# Patient Record
Sex: Female | Born: 1935 | Race: Black or African American | Hispanic: No | Marital: Single | Smoking: Never smoker
Health system: Southern US, Community
[De-identification: ages and names within clinical notes are randomized; demographics above are authoritative.]

## PROBLEM LIST (undated history)

## (undated) DIAGNOSIS — I509 Heart failure, unspecified: Secondary | ICD-10-CM

## (undated) DIAGNOSIS — I2510 Atherosclerotic heart disease of native coronary artery without angina pectoris: Secondary | ICD-10-CM

## (undated) DIAGNOSIS — E785 Hyperlipidemia, unspecified: Secondary | ICD-10-CM

## (undated) DIAGNOSIS — N189 Chronic kidney disease, unspecified: Secondary | ICD-10-CM

## (undated) DIAGNOSIS — I639 Cerebral infarction, unspecified: Secondary | ICD-10-CM

## (undated) DIAGNOSIS — I272 Pulmonary hypertension, unspecified: Secondary | ICD-10-CM

## (undated) HISTORY — DX: Heart failure, unspecified: I50.9

## (undated) HISTORY — DX: Chronic kidney disease, unspecified: N18.9

## (undated) HISTORY — PX: CORONARY ARTERY BYPASS GRAFT: SHX141

## (undated) HISTORY — PX: TOTAL HIP ARTHROPLASTY: SHX124

## (undated) HISTORY — DX: Cerebral infarction, unspecified: I63.9

## (undated) HISTORY — PX: BRAIN SURGERY: SHX531

## (undated) HISTORY — DX: Hyperlipidemia, unspecified: E78.5

## (undated) HISTORY — DX: Pulmonary hypertension, unspecified: I27.20

## (undated) HISTORY — DX: Atherosclerotic heart disease of native coronary artery without angina pectoris: I25.10

---

## 2009-02-22 ENCOUNTER — Inpatient Hospital Stay (HOSPITAL_COMMUNITY): Admission: EM | Admit: 2009-02-22 | Discharge: 2009-02-25 | Payer: Self-pay | Admitting: Emergency Medicine

## 2009-02-22 ENCOUNTER — Ambulatory Visit: Payer: Self-pay | Admitting: Internal Medicine

## 2009-02-23 ENCOUNTER — Encounter (INDEPENDENT_AMBULATORY_CARE_PROVIDER_SITE_OTHER): Payer: Self-pay | Admitting: Internal Medicine

## 2009-03-07 DIAGNOSIS — E785 Hyperlipidemia, unspecified: Secondary | ICD-10-CM | POA: Insufficient documentation

## 2009-03-07 DIAGNOSIS — I509 Heart failure, unspecified: Secondary | ICD-10-CM | POA: Insufficient documentation

## 2009-03-07 DIAGNOSIS — I2510 Atherosclerotic heart disease of native coronary artery without angina pectoris: Secondary | ICD-10-CM | POA: Insufficient documentation

## 2009-03-07 DIAGNOSIS — E119 Type 2 diabetes mellitus without complications: Secondary | ICD-10-CM | POA: Insufficient documentation

## 2009-03-07 DIAGNOSIS — I6350 Cerebral infarction due to unspecified occlusion or stenosis of unspecified cerebral artery: Secondary | ICD-10-CM | POA: Insufficient documentation

## 2009-03-07 DIAGNOSIS — I2789 Other specified pulmonary heart diseases: Secondary | ICD-10-CM | POA: Insufficient documentation

## 2009-03-08 ENCOUNTER — Encounter (INDEPENDENT_AMBULATORY_CARE_PROVIDER_SITE_OTHER): Payer: Self-pay | Admitting: Nurse Practitioner

## 2009-03-08 ENCOUNTER — Ambulatory Visit: Payer: Self-pay | Admitting: Cardiology

## 2009-03-11 ENCOUNTER — Encounter: Payer: Self-pay | Admitting: Internal Medicine

## 2009-03-15 ENCOUNTER — Ambulatory Visit: Payer: Self-pay | Admitting: Cardiology

## 2009-03-15 ENCOUNTER — Encounter: Payer: Self-pay | Admitting: Internal Medicine

## 2009-03-16 LAB — CONVERTED CEMR LAB
BUN: 39 mg/dL — ABNORMAL HIGH (ref 6–23)
CO2: 27 meq/L (ref 19–32)
Calcium: 9.5 mg/dL (ref 8.4–10.5)
GFR calc non Af Amer: 42.13 mL/min (ref >60)
Glucose, Bld: 171 mg/dL — ABNORMAL HIGH (ref 70–99)

## 2009-03-17 ENCOUNTER — Encounter: Payer: Self-pay | Admitting: Internal Medicine

## 2009-03-28 ENCOUNTER — Encounter: Payer: Self-pay | Admitting: Internal Medicine

## 2009-03-29 ENCOUNTER — Telehealth (INDEPENDENT_AMBULATORY_CARE_PROVIDER_SITE_OTHER): Payer: Self-pay | Admitting: Nurse Practitioner

## 2009-04-06 ENCOUNTER — Encounter: Admission: RE | Admit: 2009-04-06 | Discharge: 2009-04-06 | Payer: Self-pay | Admitting: Internal Medicine

## 2009-05-12 ENCOUNTER — Ambulatory Visit: Payer: Self-pay | Admitting: Cardiology

## 2009-05-12 DIAGNOSIS — I10 Essential (primary) hypertension: Secondary | ICD-10-CM | POA: Insufficient documentation

## 2009-05-23 ENCOUNTER — Ambulatory Visit: Payer: Self-pay | Admitting: Internal Medicine

## 2009-05-29 LAB — CONVERTED CEMR LAB
BUN: 22 mg/dL (ref 6–23)
Creatinine, Ser: 1.4 mg/dL — ABNORMAL HIGH (ref 0.4–1.2)
GFR calc non Af Amer: 45.6 mL/min (ref >60)
Glucose, Bld: 93 mg/dL (ref 70–99)
Potassium: 4.4 meq/L (ref 3.5–5.1)

## 2009-06-10 ENCOUNTER — Ambulatory Visit: Payer: Self-pay | Admitting: Emergency Medicine

## 2009-06-10 ENCOUNTER — Telehealth: Payer: Self-pay | Admitting: Internal Medicine

## 2009-06-10 DIAGNOSIS — R0602 Shortness of breath: Secondary | ICD-10-CM | POA: Insufficient documentation

## 2009-06-14 ENCOUNTER — Ambulatory Visit: Payer: Self-pay | Admitting: Cardiology

## 2009-06-14 ENCOUNTER — Inpatient Hospital Stay (HOSPITAL_COMMUNITY): Admission: AD | Admit: 2009-06-14 | Discharge: 2009-06-17 | Payer: Self-pay | Admitting: Cardiology

## 2009-06-19 ENCOUNTER — Ambulatory Visit: Payer: Self-pay | Admitting: Internal Medicine

## 2009-06-24 ENCOUNTER — Encounter: Payer: Self-pay | Admitting: Cardiology

## 2009-07-05 ENCOUNTER — Encounter: Payer: Self-pay | Admitting: Internal Medicine

## 2009-07-13 ENCOUNTER — Ambulatory Visit: Payer: Self-pay | Admitting: Emergency Medicine

## 2009-07-15 ENCOUNTER — Encounter: Payer: Self-pay | Admitting: Internal Medicine

## 2009-08-01 ENCOUNTER — Ambulatory Visit: Payer: Self-pay | Admitting: Cardiology

## 2009-08-23 ENCOUNTER — Ambulatory Visit: Payer: Self-pay | Admitting: Cardiology

## 2009-08-23 ENCOUNTER — Inpatient Hospital Stay (HOSPITAL_COMMUNITY): Admission: AD | Admit: 2009-08-23 | Discharge: 2009-09-19 | Payer: Self-pay | Admitting: Cardiology

## 2009-08-23 ENCOUNTER — Ambulatory Visit: Payer: Self-pay | Admitting: Cardiovascular Disease

## 2009-08-23 ENCOUNTER — Telehealth: Payer: Self-pay | Admitting: Cardiology

## 2009-08-29 ENCOUNTER — Ambulatory Visit: Payer: Self-pay | Admitting: Physical Medicine & Rehabilitation

## 2009-09-06 ENCOUNTER — Encounter: Payer: Self-pay | Admitting: Internal Medicine

## 2009-09-07 ENCOUNTER — Encounter: Payer: Self-pay | Admitting: Cardiology

## 2009-09-10 ENCOUNTER — Encounter: Payer: Self-pay | Admitting: Cardiology

## 2009-09-13 ENCOUNTER — Encounter: Payer: Self-pay | Admitting: Cardiology

## 2009-09-19 ENCOUNTER — Encounter: Payer: Self-pay | Admitting: Cardiology

## 2009-09-21 ENCOUNTER — Encounter: Payer: Self-pay | Admitting: Cardiology

## 2009-09-21 ENCOUNTER — Encounter: Payer: Self-pay | Admitting: Internal Medicine

## 2009-09-21 LAB — CONVERTED CEMR LAB: Prothrombin Time: 28.3 s

## 2009-09-23 ENCOUNTER — Encounter: Payer: Self-pay | Admitting: Cardiology

## 2009-09-26 ENCOUNTER — Encounter: Payer: Self-pay | Admitting: Cardiology

## 2009-09-26 LAB — CONVERTED CEMR LAB
POC INR: 2
Prothrombin Time: 17.3 s

## 2009-09-30 ENCOUNTER — Encounter: Payer: Self-pay | Admitting: Cardiology

## 2009-09-30 ENCOUNTER — Ambulatory Visit: Payer: Self-pay | Admitting: Cardiology

## 2009-09-30 DIAGNOSIS — I495 Sick sinus syndrome: Secondary | ICD-10-CM

## 2009-10-03 ENCOUNTER — Encounter: Payer: Self-pay | Admitting: Cardiology

## 2009-10-03 LAB — CONVERTED CEMR LAB: POC INR: 2.1

## 2009-10-10 ENCOUNTER — Encounter: Payer: Self-pay | Admitting: Cardiology

## 2009-10-10 ENCOUNTER — Encounter (INDEPENDENT_AMBULATORY_CARE_PROVIDER_SITE_OTHER): Payer: Self-pay | Admitting: Cardiology

## 2009-10-10 LAB — CONVERTED CEMR LAB
POC INR: 5.3
Prothrombin Time: 36.3 s

## 2009-10-17 ENCOUNTER — Encounter: Payer: Self-pay | Admitting: Cardiology

## 2009-10-18 ENCOUNTER — Encounter: Payer: Self-pay | Admitting: Cardiology

## 2009-10-18 LAB — CONVERTED CEMR LAB
POC INR: 2.6
Prothrombin Time: 19.6 s

## 2009-10-27 ENCOUNTER — Telehealth (INDEPENDENT_AMBULATORY_CARE_PROVIDER_SITE_OTHER): Payer: Self-pay | Admitting: *Deleted

## 2009-10-28 ENCOUNTER — Encounter: Payer: Self-pay | Admitting: Cardiology

## 2009-11-01 ENCOUNTER — Encounter: Payer: Self-pay | Admitting: Cardiology

## 2009-11-02 ENCOUNTER — Encounter: Payer: Self-pay | Admitting: Cardiology

## 2009-11-03 ENCOUNTER — Encounter: Payer: Self-pay | Admitting: Cardiology

## 2009-11-03 ENCOUNTER — Ambulatory Visit: Payer: Self-pay | Admitting: Cardiology

## 2009-11-03 DIAGNOSIS — Z950 Presence of cardiac pacemaker: Secondary | ICD-10-CM | POA: Insufficient documentation

## 2009-11-03 DIAGNOSIS — N259 Disorder resulting from impaired renal tubular function, unspecified: Secondary | ICD-10-CM | POA: Insufficient documentation

## 2009-11-04 LAB — CONVERTED CEMR LAB
Albumin: 3.1 g/dL — ABNORMAL LOW (ref 3.5–5.2)
BUN: 22 mg/dL (ref 6–23)
Basophils Absolute: 0 10*3/uL (ref 0.0–0.1)
Basophils Relative: 0.3 % (ref 0.0–3.0)
CO2: 34 meq/L — ABNORMAL HIGH (ref 19–32)
Eosinophils Absolute: 0.2 10*3/uL (ref 0.0–0.7)
GFR calc non Af Amer: 49.63 mL/min (ref >60)
Glucose, Bld: 121 mg/dL — ABNORMAL HIGH (ref 70–99)
Hemoglobin: 12 g/dL (ref 12.0–15.0)
Lymphs Abs: 2.5 10*3/uL (ref 0.7–4.0)
MCHC: 32.9 g/dL (ref 30.0–36.0)
MCV: 95.6 fL (ref 78.0–100.0)
Monocytes Absolute: 0.6 10*3/uL (ref 0.1–1.0)
Neutro Abs: 3 10*3/uL (ref 1.4–7.7)
Potassium: 4.8 meq/L (ref 3.5–5.1)
RBC: 3.82 M/uL — ABNORMAL LOW (ref 3.87–5.11)
RDW: 14.6 % (ref 11.5–14.6)
Sodium: 143 meq/L (ref 135–145)
Total Protein: 6.3 g/dL (ref 6.0–8.3)

## 2009-11-07 ENCOUNTER — Encounter (INDEPENDENT_AMBULATORY_CARE_PROVIDER_SITE_OTHER): Payer: Self-pay | Admitting: Cardiology

## 2009-11-08 ENCOUNTER — Encounter: Payer: Self-pay | Admitting: Cardiovascular Disease

## 2009-11-08 LAB — CONVERTED CEMR LAB
POC INR: 1.7
Prothrombin Time: 19.8 s

## 2009-11-09 ENCOUNTER — Ambulatory Visit: Payer: Self-pay | Admitting: Cardiology

## 2009-11-09 ENCOUNTER — Encounter: Payer: Self-pay | Admitting: Physician Assistant

## 2009-11-09 DIAGNOSIS — I5033 Acute on chronic diastolic (congestive) heart failure: Secondary | ICD-10-CM

## 2009-11-14 ENCOUNTER — Encounter: Payer: Self-pay | Admitting: Physician Assistant

## 2009-11-15 ENCOUNTER — Encounter: Payer: Self-pay | Admitting: Cardiology

## 2009-11-15 LAB — CONVERTED CEMR LAB
POC INR: 1.5
Prothrombin Time: 15.1 s

## 2009-11-16 ENCOUNTER — Encounter: Payer: Self-pay | Admitting: Physician Assistant

## 2009-11-16 ENCOUNTER — Ambulatory Visit: Payer: Self-pay | Admitting: Cardiovascular Disease

## 2009-11-16 ENCOUNTER — Ambulatory Visit: Payer: Self-pay | Admitting: Cardiology

## 2009-11-16 DIAGNOSIS — I4891 Unspecified atrial fibrillation: Secondary | ICD-10-CM

## 2009-11-16 DIAGNOSIS — I25810 Atherosclerosis of coronary artery bypass graft(s) without angina pectoris: Secondary | ICD-10-CM | POA: Insufficient documentation

## 2009-11-16 LAB — CONVERTED CEMR LAB
BUN: 20 mg/dL (ref 6–23)
Creatinine, Ser: 1.4 mg/dL — ABNORMAL HIGH (ref 0.4–1.2)
GFR calc non Af Amer: 45.55 mL/min (ref >60)
Glucose, Bld: 142 mg/dL — ABNORMAL HIGH (ref 70–99)
Potassium: 4.4 meq/L (ref 3.5–5.1)

## 2009-11-17 ENCOUNTER — Encounter: Payer: Self-pay | Admitting: Cardiology

## 2009-11-23 ENCOUNTER — Encounter: Payer: Self-pay | Admitting: Internal Medicine

## 2009-11-23 ENCOUNTER — Ambulatory Visit: Payer: Self-pay | Admitting: Internal Medicine

## 2009-11-23 LAB — CONVERTED CEMR LAB
Bilirubin Urine: NEGATIVE
Ketones, ur: NEGATIVE mg/dL
Specific Gravity, Urine: 1.015 (ref 1.000–1.030)
Urine Glucose: NEGATIVE mg/dL
Urobilinogen, UA: 1 (ref 0.0–1.0)

## 2009-11-30 ENCOUNTER — Encounter: Payer: Self-pay | Admitting: Internal Medicine

## 2009-11-30 LAB — CONVERTED CEMR LAB
POC INR: 1.8
Prothrombin Time: 21.7 s

## 2009-12-02 ENCOUNTER — Encounter: Payer: Self-pay | Admitting: Cardiology

## 2009-12-07 ENCOUNTER — Ambulatory Visit: Payer: Self-pay | Admitting: Cardiology

## 2009-12-07 ENCOUNTER — Encounter: Payer: Self-pay | Admitting: Cardiovascular Disease

## 2009-12-07 ENCOUNTER — Encounter: Payer: Self-pay | Admitting: Cardiology

## 2009-12-07 LAB — CONVERTED CEMR LAB: Prothrombin Time: 19.1 s

## 2009-12-08 LAB — CONVERTED CEMR LAB
CO2: 38 meq/L — ABNORMAL HIGH (ref 19–32)
Calcium: 9.7 mg/dL (ref 8.4–10.5)
GFR calc non Af Amer: 39.04 mL/min (ref >60)
INR: 1.8 — ABNORMAL HIGH (ref 0.8–1.0)
Potassium: 4.1 meq/L (ref 3.5–5.1)
Prothrombin Time: 19.1 s — ABNORMAL HIGH (ref 9.1–11.7)
Sodium: 141 meq/L (ref 135–145)

## 2009-12-10 ENCOUNTER — Encounter: Payer: Self-pay | Admitting: Cardiology

## 2009-12-14 ENCOUNTER — Encounter: Payer: Self-pay | Admitting: Cardiology

## 2009-12-21 ENCOUNTER — Ambulatory Visit: Payer: Self-pay | Admitting: Internal Medicine

## 2009-12-26 ENCOUNTER — Encounter: Payer: Self-pay | Admitting: Cardiology

## 2010-01-17 ENCOUNTER — Encounter: Payer: Self-pay | Admitting: Cardiology

## 2010-01-18 ENCOUNTER — Ambulatory Visit: Payer: Self-pay | Admitting: Internal Medicine

## 2010-01-20 ENCOUNTER — Encounter: Payer: Self-pay | Admitting: Cardiology

## 2010-02-07 ENCOUNTER — Telehealth: Payer: Self-pay | Admitting: Cardiology

## 2010-02-09 ENCOUNTER — Ambulatory Visit: Payer: Self-pay | Admitting: Cardiology

## 2010-02-10 LAB — CONVERTED CEMR LAB
BUN: 36 mg/dL — ABNORMAL HIGH (ref 6–23)
CO2: 34 meq/L — ABNORMAL HIGH (ref 19–32)
Chloride: 106 meq/L (ref 96–112)
Creatinine, Ser: 1.7 mg/dL — ABNORMAL HIGH (ref 0.4–1.2)

## 2010-02-17 ENCOUNTER — Ambulatory Visit: Payer: Self-pay | Admitting: Cardiovascular Disease

## 2010-03-01 ENCOUNTER — Encounter: Payer: Self-pay | Admitting: Cardiology

## 2010-03-17 ENCOUNTER — Ambulatory Visit: Payer: Self-pay | Admitting: Cardiology

## 2010-04-14 ENCOUNTER — Ambulatory Visit: Payer: Self-pay | Admitting: Cardiovascular Disease

## 2010-04-18 ENCOUNTER — Encounter: Payer: Self-pay | Admitting: Cardiology

## 2010-05-08 IMAGING — CR DG CHEST 1V PORT
1 series · 1 of 1 positions shown · non-contrast
Comparison: 02/22/2009

CLINICAL DATA: Evaluate PICC line

PORTABLE CHEST - 1 VIEW

[view not recorded]
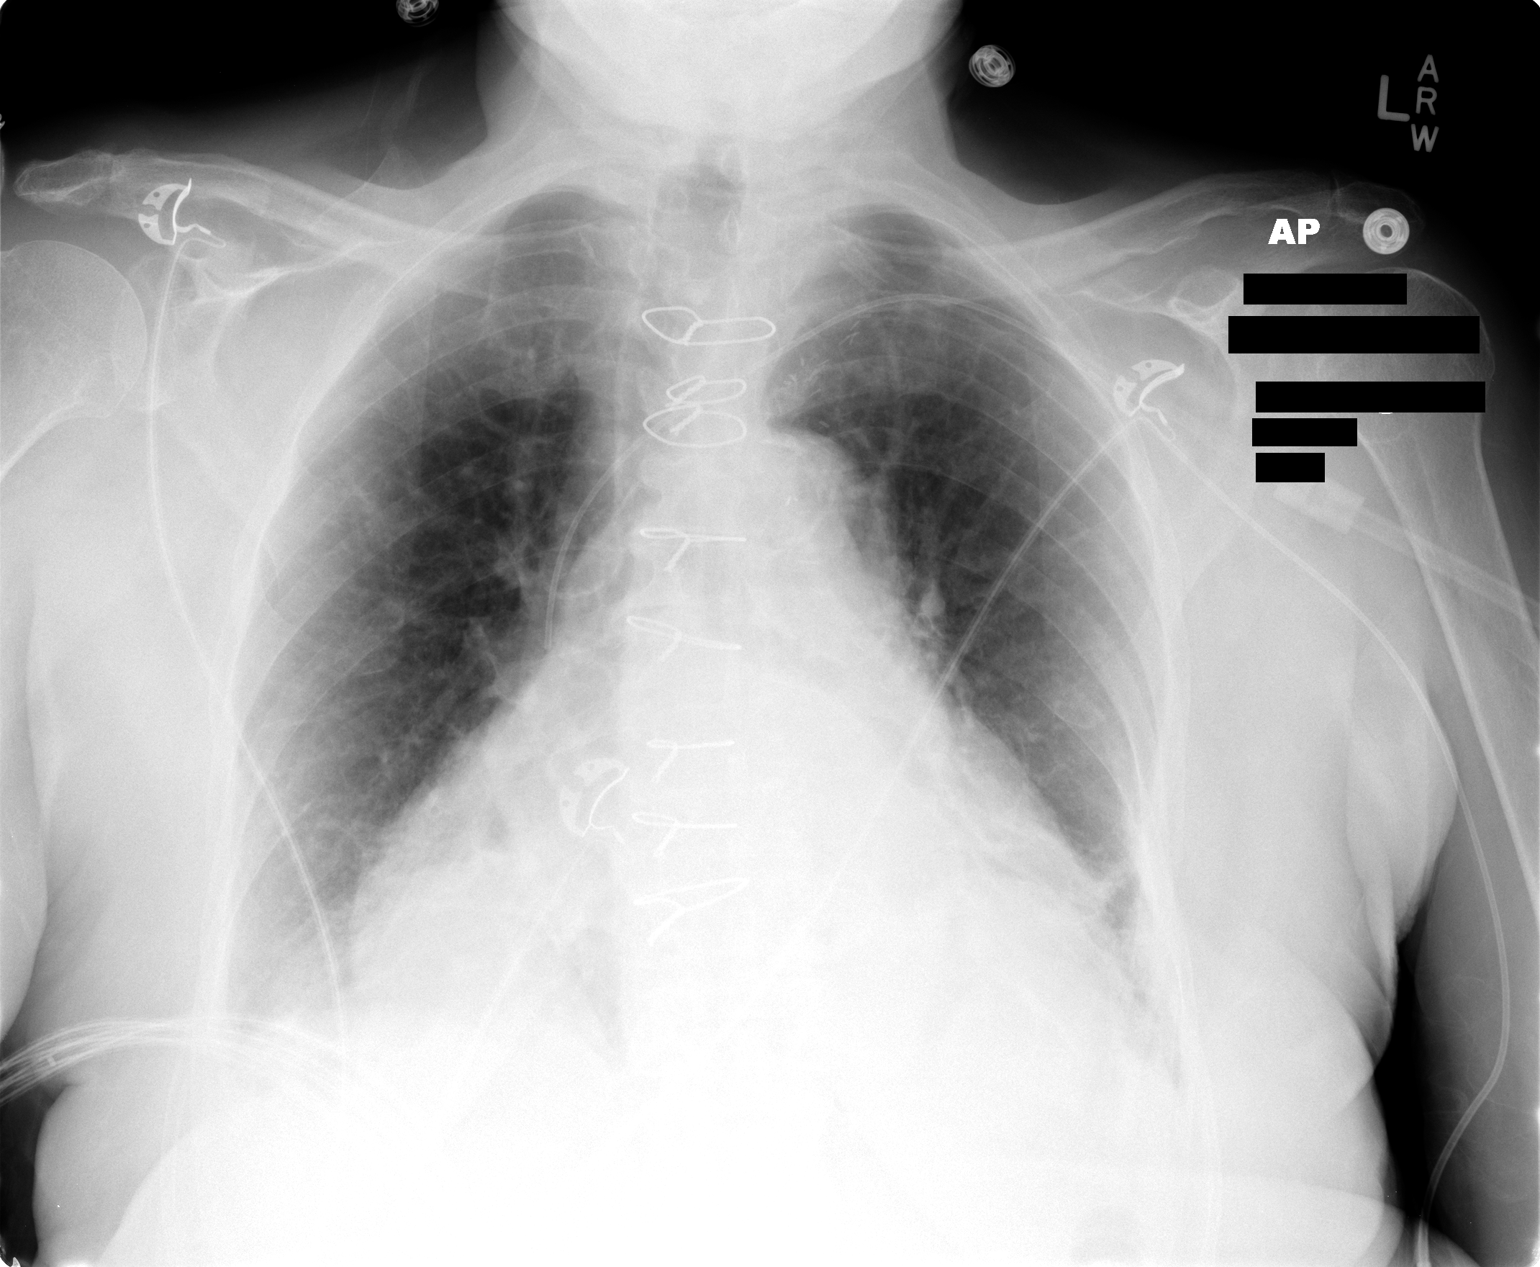

[1 of 1 positions shown; findings below may reference images not displayed]

FINDINGS: The tip of the PICC catheter is in the superior vena
cava, 3 cm proximal to the SVC right atrial junction.  Cardiac
enlargement.  Bibasilar atelectasis.
IMPRESSION: PICC catheter in superior vena cava.  The tip is 3 cm proximal to
the SVC right atrial junction.

## 2010-05-12 ENCOUNTER — Ambulatory Visit: Payer: Self-pay | Admitting: Cardiology

## 2010-06-08 ENCOUNTER — Encounter: Payer: Self-pay | Admitting: Cardiology

## 2010-06-08 LAB — CONVERTED CEMR LAB
POC INR: 3
Prothrombin Time: 31 s

## 2010-07-06 ENCOUNTER — Encounter: Payer: Self-pay | Admitting: Internal Medicine

## 2010-07-06 LAB — CONVERTED CEMR LAB: POC INR: 2.5

## 2010-07-07 ENCOUNTER — Encounter (INDEPENDENT_AMBULATORY_CARE_PROVIDER_SITE_OTHER): Payer: Self-pay | Admitting: *Deleted

## 2010-08-01 ENCOUNTER — Encounter: Payer: Self-pay | Admitting: Cardiovascular Disease

## 2010-08-01 LAB — CONVERTED CEMR LAB
POC INR: 2.7
Prothrombin Time: 28.2 s

## 2010-08-10 ENCOUNTER — Telehealth (INDEPENDENT_AMBULATORY_CARE_PROVIDER_SITE_OTHER): Payer: Self-pay | Admitting: *Deleted

## 2010-08-11 ENCOUNTER — Ambulatory Visit: Payer: Self-pay | Admitting: Cardiology

## 2010-08-18 ENCOUNTER — Encounter: Payer: Self-pay | Admitting: Internal Medicine

## 2010-08-18 LAB — CONVERTED CEMR LAB: POC INR: 1.73

## 2010-08-25 ENCOUNTER — Encounter: Payer: Self-pay | Admitting: Cardiology

## 2010-09-01 ENCOUNTER — Encounter: Payer: Self-pay | Admitting: Cardiology

## 2010-09-01 LAB — CONVERTED CEMR LAB: Prothrombin Time: 17.4 s

## 2010-09-05 ENCOUNTER — Encounter: Payer: Self-pay | Admitting: Cardiology

## 2010-09-14 ENCOUNTER — Telehealth: Payer: Self-pay | Admitting: Cardiology

## 2010-09-14 ENCOUNTER — Encounter: Payer: Self-pay | Admitting: Cardiology

## 2010-09-15 ENCOUNTER — Encounter: Payer: Self-pay | Admitting: Cardiology

## 2010-09-15 ENCOUNTER — Encounter: Payer: Self-pay | Admitting: Internal Medicine

## 2010-09-15 LAB — CONVERTED CEMR LAB: Prothrombin Time: 17.2 s

## 2010-09-18 ENCOUNTER — Encounter: Payer: Self-pay | Admitting: Cardiology

## 2010-09-20 ENCOUNTER — Encounter (INDEPENDENT_AMBULATORY_CARE_PROVIDER_SITE_OTHER): Payer: Self-pay | Admitting: *Deleted

## 2010-09-22 ENCOUNTER — Telehealth: Payer: Self-pay | Admitting: Cardiology

## 2010-09-25 ENCOUNTER — Encounter: Payer: Self-pay | Admitting: Cardiology

## 2010-09-29 ENCOUNTER — Encounter: Payer: Self-pay | Admitting: Cardiology

## 2010-10-10 NOTE — Progress Notes (Signed)
Summary: r/s PT/INR done by home health  Phone Note Other Incoming   Caller: Lovenia Kim, RN [facility] Summary of Call: Marcelino Duster, RN, [facility] called office and states they are having staffing issues and are unable to draw pt's PT/INR today.  Advised pt was actually due to be drawn on 10/26/09.  She states HH RN put order in pt was to be drawn next week, but no actual date.  Again she stated they could not draw today and would draw tomorrow.  She will call pt and verify she is not having any bleeding problems or any other medical concerns at the present time. Initial call taken by: Cloyde Reams RN,  October 27, 2009 11:55 AM

## 2010-10-10 NOTE — Progress Notes (Signed)
Summary: Records Request  Faxed OV, EKG & Labs to [facility] at [facility] ([phone]). Debby Freiberg  August 10, 2010 4:20 PM

## 2010-10-10 NOTE — Medication Information (Signed)
Summary: Coumadin Clinic  Anticoagulant Therapy  Managed by: Cloyde Reams, RN, BSN Referring MD: Jens Som MD, Arlys John PCP: Dr. Doran Durand Supervising MD: Johney Frame MD, Fayrene Fearing Indication 1: Atrial Fibrillation Lab Used: [facility]  [place] Site: [address] PT 15.2 INR POC 1.5 INR RANGE 2.0-3.0    Bleeding/hemorrhagic complications: no     Any changes in medication regimen? no     Any missed doses?: no       Is patient compliant with meds? yes       Allergies: No Known Drug Allergies  Anticoagulation Management History:      Her anticoagulation is being managed by telephone today.  Positive risk factors for bleeding include an age of 75 years or older, history of CVA/TIA, and presence of serious comorbidities.  The bleeding index is 'high risk'.  Positive CHADS2 values include History of CHF, History of HTN, Age > 75 years old, History of Diabetes, and Prior Stroke/CVA/TIA.  Her last INR was 3.69.  Prothrombin time is 15.2.  Anticoagulation responsible provider: Zyair Rhein MD, Fayrene Fearing.  INR POC: 1.5.    Anticoagulation Management Assessment/Plan:      The patient's current anticoagulation dose is Warfarin sodium 2 mg tabs: as directed.  The target INR is 2.0-3.0.  The next INR is due 11/30/2009.  Anticoagulation instructions were given to [facility], RN.  Results were reviewed/authorized by Cloyde Reams, RN, BSN.  She was notified by Cloyde Reams RN.         Prior Anticoagulation Instructions: INR 1.5  Spoke with Claris Che, [facility] RN while at pt's home.  Take 2 tablets tomorrow then start taking 1 tablet daily except 2 tablets on Tuesdays, Thursdays, and Saturdays.  Recheck in 1 week.    Current Anticoagulation Instructions: INR 1.5  Spoke with Elnita Maxwell, [facility], RN while at pt's home.  Pt has been taking 4mg  daily except 2mg  on Fri,Su,M.  Advised to have pt take 5mg  today then start taking 4mg  daily except 2mg  on Mondays and Fridays.  Recheck in 1 week.

## 2010-10-10 NOTE — Medication Information (Signed)
Summary: Coumadin Clinic  Anticoagulant Therapy  Managed by: Cloyde Reams, RN, BSN Referring MD: Jens Som MD, Arlys John PCP: Dr. Doran Durand Supervising MD: Shirlee Latch MD, [place] Indication 1: Atrial Fibrillation Lab Used: [facility]  [place] Site: [address] PT 23.8 INR POC 2.0 INR RANGE 2.0-3.0           Allergies: No Known Drug Allergies  Anticoagulation Management History:      Positive risk factors for bleeding include an age of 75 years or older, history of CVA/TIA, and presence of serious comorbidities.  The bleeding index is 'high risk'.  Positive CHADS2 values include History of CHF, History of HTN, Age > 50 years old, History of Diabetes, and Prior Stroke/CVA/TIA.  Her last INR was 3.69.  Prothrombin time is 23.8.  Anticoagulation responsible provider: Shirlee Latch MD, [place].  INR POC: 2.0.    Anticoagulation Management Assessment/Plan:      The patient's current anticoagulation dose is Warfarin sodium 2 mg tabs: as directed.  The target INR is 2.0-3.0.  The next INR is due 11/07/2009.  Anticoagulation instructions were given to Dennie Bible, [facility] RN.  Results were reviewed/authorized by Cloyde Reams, RN, BSN.  She was notified by Cloyde Reams RN.         Prior Anticoagulation Instructions: INR 2.6  Called spoke with pt's daughter advised to continue on same dosage 2mg  daily. Called spoke with Elnita Maxwell, [place], RN.  Advised pt instructed to continue on to recheck PT/INR on 10/26/09.  Current Anticoagulation Instructions: INR 2.0  Spoke with Dennie Bible, [facility] RN while at pt's home.  Advised to incr dosage to 2mg  daily except 4mg  on Fridays.  Recheck in 10 days.

## 2010-10-10 NOTE — Medication Information (Signed)
Summary: rov/sp  Anticoagulant Therapy  Managed by: Weston Brass, PharmD Referring MD: Jens Som MD, Arlys John PCP: Dr. Doran Durand Supervising MD: Ladona Ridgel MD, Sharlot Gowda Indication 1: Atrial Fibrillation Lab Used: [facility] [place] Site: [address] INR POC 2.8 INR RANGE 2.0-3.0  Dietary changes: no    Health status changes: no    Bleeding/hemorrhagic complications: no    Recent/future hospitalizations: no    Any changes in medication regimen? no    Recent/future dental: no  Any missed doses?: no       Is patient compliant with meds? yes       Allergies: No Known Drug Allergies  Anticoagulation Management History:      The patient is taking warfarin and comes in today for a routine follow up visit.  Positive risk factors for bleeding include an age of 75 years or older, history of CVA/TIA, and presence of serious comorbidities.  The bleeding index is 'high risk'.  Positive CHADS2 values include History of CHF, History of HTN, Age > 75 years old, History of Diabetes, and Prior Stroke/CVA/TIA.  Her last INR was 1.8 ratio.  Anticoagulation responsible provider: Ladona Ridgel MD, Sharlot Gowda.  INR POC: 2.8.  Cuvette Lot#: [id number].  Exp: 04/2011.    Anticoagulation Management Assessment/Plan:      The patient's current anticoagulation dose is Warfarin sodium 2 mg tabs: as directed.  The target INR is 2.0-3.0.  The next INR is due 02/15/2010.  Anticoagulation instructions were given to daughter/diane-ahc.  Results were reviewed/authorized by Weston Brass, PharmD.  She was notified by Weston Brass PharmD.         Prior Anticoagulation Instructions: INR 2.8  Change dose to 2 tablets every day except 1 tablet on Sunday   Current Anticoagulation Instructions: INR 2.8  Continue same dose of 2 tablets every day except 1 tablet on Sunday

## 2010-10-10 NOTE — Medication Information (Signed)
Summary: Coumadin Clinic  Anticoagulant Therapy  Managed by: Bethena Midget, RN, BSN Referring MD: Jens Som MD, Arlys John PCP: Dr. Doran Durand Supervising MD: Clifton James MD, Cristal Deer Indication 1: Atrial Fibrillation Lab Used: [facility]  [place] Site: [address] PT 19.8 INR POC 1.70 INR RANGE 2.0-3.0  Dietary changes: no    Health status changes: yes       Details: SOB, daughter states a history of this.  Bleeding/hemorrhagic complications: no    Recent/future hospitalizations: no    Any changes in medication regimen? yes       Details: LASIX INCREASED TO 40MG S DAILY  Recent/future dental: no  Any missed doses?: no       Is patient compliant with meds? yes       Allergies: No Known Drug Allergies  Anticoagulation Management History:      Her anticoagulation is being managed by telephone today.  Positive risk factors for bleeding include an age of 75 years or older, history of CVA/TIA, and presence of serious comorbidities.  The bleeding index is 'high risk'.  Positive CHADS2 values include History of CHF, History of HTN, Age > 75 years old, History of Diabetes, and Prior Stroke/CVA/TIA.  Her last INR was 3.69.  Prothrombin time is 19.8.  Anticoagulation responsible provider: Clifton James MD, Cristal Deer.  INR POC: 1.70.    Anticoagulation Management Assessment/Plan:      The patient's current anticoagulation dose is Warfarin sodium 2 mg tabs: as directed.  The target INR is 2.0-3.0.  The next INR is due 11/15/2009.  Anticoagulation instructions were given to daughter.  Results were reviewed/authorized by Bethena Midget, RN, BSN.  She was notified by Bethena Midget, RN, BSN.         Prior Anticoagulation Instructions: INR 2.0  Spoke with Dennie Bible, [facility] RN while at pt's home.  Advised to incr dosage to 2mg  daily except 4mg  on Fridays.  Recheck in 10 days.    Current Anticoagulation Instructions: LMOM .Marland KitchenBethena Midget, RN, BSN  November 08, 2009 2:51 PM  Spoke with daughter  and dosage given. Also spoke with Amaryllis Dyke at [facility] and gave orders with dose and f/u 11/15/09

## 2010-10-10 NOTE — Letter (Signed)
Summary: [facility]   Imported By: Marylou Mccoy 03/24/2010 12:01:27  _____________________________________________________________________  External Attachment:    Type:   Image     Comment:   External Document

## 2010-10-10 NOTE — Medication Information (Signed)
Summary: Coumadin Clinic  Anticoagulant Therapy  Managed by: Cloyde Reams, RN, BSN Referring MD: Jens Som MD, Arlys John PCP: Dr. Doran Durand Supervising MD: Gala Romney MD, Reuel Boom Indication 1: Atrial Fibrillation Lab Used: [facility] [place] Site: [address] INR POC 2.5 INR RANGE 2.0-3.0    Bleeding/hemorrhagic complications: no     Any changes in medication regimen? no     Any missed doses?: no       Is patient compliant with meds? yes       Allergies: No Known Drug Allergies  Anticoagulation Management History:      Her anticoagulation is being managed by telephone today.  Positive risk factors for bleeding include an age of 39 years or older, history of CVA/TIA, and presence of serious comorbidities.  The bleeding index is 'high risk'.  Positive CHADS2 values include History of CHF, History of HTN, Age > 56 years old, History of Diabetes, and Prior Stroke/CVA/TIA.  Her last INR was 1.8 ratio.  Anticoagulation responsible provider: Kameren Pargas MD, Reuel Boom.  INR POC: 2.5.  Exp: 06/2011.    Anticoagulation Management Assessment/Plan:      The patient's current anticoagulation dose is Warfarin sodium 2 mg tabs: as directed.  The target INR is 2.0-3.0.  The next INR is due 08/01/2010.  Anticoagulation instructions were given to daughter.  Results were reviewed/authorized by Cloyde Reams, RN, BSN.  She was notified by Cloyde Reams RN.         Prior Anticoagulation Instructions: INR 3.0 Today take 2mg s then resume 4mg s daily except 2mg s on Sundays. Recheck in 4 weeks. Rx left for daughter to pick to have pt INR drawn out of town.   Current Anticoagulation Instructions: INR 2.5  Called spoke with pt's daughter advised to have pt continue on same dosage 2 tablets daily except 1 tablet on Sundays.  Recheck INR on 08/01/10. Rx left at front desk for pt's daughter to pick-up as pt will be out of town for Thanksgiving.

## 2010-10-10 NOTE — Assessment & Plan Note (Signed)
Summary: 1wk f/u dicuss with dr hochrein   Visit Type:  Follow-up Referring Provider:  [facility] Primary Provider:  Dr. Doran Durand  CC:  Chest pains- bilateral leg edema.  History of Present Illness: This is an 77 year old, African American female, patient, who I saw last week for Dr. Charlies Constable for increased diastolic heart failure. She had not lost weight and I increased her diuretics. She only a lost 2 pounds since I saw her last week on our scales, but 5 pounds on her scales. She is still feeling poorly with dyspnea on exertion with very little activity. She claims to be keeping her legs elevated and, following a low sodium diet.She also had some chest tightness with dyspnea on exertion, and she does much of anything.  Current Medications (verified): 1)  Aspirin 81 Mg  Tabs (Aspirin) .... Daily 2)  Metoprolol Succinate 100 Mg Xr24h-Tab (Metoprolol Succinate) .... One Daily 3)  Zocor 20 Mg Tabs (Simvastatin) .Marland Kitchen.. 1 By Mouth Daily 4)  Niacin Cr 500 Mg Cr-Tabs (Niacin) .... Qhs 5)  Lantus 100 Unit/ml Soln (Insulin Glargine) .... As Directed 6)  Acetaminophen-Codeine #2 300-15 Mg Tabs (Acetaminophen-Codeine) .... As Needed 7)  Citalopram Hydrobromide 20 Mg Tabs (Citalopram Hydrobromide) .... One Pill For Mood 8)  Aricept 5 Mg Tabs (Donepezil Hcl) .... Two Tabs At Night Time 9)  Nova Max Glucose Test  Strp (Glucose Blood) .... Check Sugar Two Times A Day 10)  Acetaminophen 325 Mg  Tabs (Acetaminophen) .... As Needed 11)  Furosemide 40 Mg Tabs (Furosemide) .... Take Two Tablets Tabs Every Morning and One Tab Every Evening 12)  Vitamin D3 1000 Unit Tabs (Cholecalciferol) .Marland Kitchen.. 1 By Mouth Daily 13)  Klor-Con M10 10 Meq Cr-Tabs (Potassium Chloride Crys Cr) .Marland Kitchen.. 1 By Mouth Daily 14)  Warfarin Sodium 2 Mg Tabs (Warfarin Sodium) .... As Directed 15)  Claritin 10 Mg Tabs (Loratadine) .Marland Kitchen.. 1 By Mouth Daily 16)  Lisinopril 10 Mg Tabs (Lisinopril) .Marland Kitchen.. 1 By Mouth Daily 17)  Niacin 500 Mg Tabs  (Niacin) .Marland Kitchen.. 1 Tab Once Daily  Allergies (verified): No Known Drug Allergies  Past History:  Past Medical History: Last updated: 09/30/2009  1. Coronary disease.   2. Pulmonary hypertension.   3. Congestive heart failure.   4. Ejection fraction of 45% per catheterization  in May and mild       diastolic dysfunction.   5. Diabetes.   6. Dyslipidemia.   7. CVA.   8. Tachybradycardia syndrome status post permanent pacemaker placement     Review of Systems       see history of present illness  Vital Signs:  Patient profile:   77 year old female Height:      67 inches Weight:      167 pounds Pulse rate:   62 / minute Pulse rhythm:   regular Resp:     20 per minute BP sitting:   164 / 80  (left arm) Cuff size:   large  Vitals Entered By: Vikki Ports (November 16, 2009 12:02 PM)  Physical Exam  General:   Well-nournished, in no acute distress. Neck: increase JVD, HJR, no Bruit, or thyroid enlargement Lungs: No tachypnea, clear without wheezing, rales, or rhonchi Cardiovascular: RRR, PMI not displaced, heart sounds normal, no murmurs, gallops, bruit, thrill, or heave. Abdomen: BS normal. Soft without organomegaly, masses, lesions or tenderness. Extremities: +3-4 edema in her lower extremities. All the way up her thighs. Good distal pulses bilateral SKin: Warm, no lesions or  rashes  Musculoskeletal: No deformities Neuro: no focal signs    PPM Specifications Following MD:  Hillis Range, MD     PPM Vendor:  [employer]     PPM Model Number:  [id number]     PPM Serial Number:  [id number] PPM DOI:  09/06/2009     PPM Implanting MD:  Hillis Range, MD  Lead 1    Location: [facility]     DOI: 09/06/2009     Model #: [id number]     Serial #: [id number]     Status: active Lead 2    Location: RV     DOI: 09/06/2009     Model #: [id number]     Serial #: [id number]     Status: active  Magnet Response Rate:  BOL 85 ERI  65  Indications:  Sick sinus syndrome   PPM Follow Up Pacer Dependent:  No        Episodes Coumadin:  Yes  Parameters Mode:  DDDR+     Lower Rate Limit:  60     Upper Rate Limit:  130 Paced AV Delay:  150     Sensed AV Delay:  120  Impression & Recommendations:  Problem # 1:  DIASTOLIC HEART FAILURE, ACUTE ON CHRONIC (ICD-428.33) Patient continues to have significant edema with diastolic heart failure. Bowel increased her Lasix once again. If she does not respond to this we may need to add Zaroxolyn. Her updated medication list for this problem includes:    Aspirin 81 Mg Tabs (Aspirin) .Marland Kitchen... Daily    Metoprolol Succinate 100 Mg Xr24h-tab (Metoprolol succinate) ..... One daily    Furosemide 40 Mg Tabs (Furosemide) .Marland Kitchen... Take two tablets tabs twice a day    Warfarin Sodium 2 Mg Tabs (Warfarin sodium) .Marland Kitchen... As directed    Lisinopril 10 Mg Tabs (Lisinopril) .Marland Kitchen... 1 by mouth daily  Problem # 2:  RENAL INSUFFICIENCY (ICD-588.9) labs ordered today Orders: T-Culture, Urine ([id number]) TLB-Udip ONLY (81003-UDIP)  Problem # 3:  CORONARY ATHEROSCLEROSIS OF ARTERY BYPASS GRAFT (ICD-414.04) Patient is having some mild chest tightness with her dyspnea on exertion. We will consider adding isosorbide if this continues. Her updated medication list for this problem includes:    Aspirin 81 Mg Tabs (Aspirin) .Marland Kitchen... Daily    Metoprolol Succinate 100 Mg Xr24h-tab (Metoprolol succinate) ..... One daily    Warfarin Sodium 2 Mg Tabs (Warfarin sodium) .Marland Kitchen... As directed    Lisinopril 10 Mg Tabs (Lisinopril) .Marland Kitchen... 1 by mouth daily  Patient Instructions: 1)  Your physician recommends that you schedule a follow-up appointment in: one week with Wynell Balloon, PA-C; and Dr. Antoine Poche in two weeks. 2)  Your physician recommends that you get lab work done today: BMP and BNP previously ordered and a UA and urine C & S. 3)  Your physician has recommended you make the following change in your medication: increase your lasix to 80 mg twice a day and increase your potassium  to 20 mEq twice a  day. Prescriptions: KLOR-CON M10 10 MEQ CR-TABS (POTASSIUM CHLORIDE CRYS CR) take two by mouth twice a day  #120 x 12   Entered by:   Minerva Areola, RN, BSN   Authorized by:   [name], PA-C   Signed by:   Minerva Areola, RN, BSN on 11/16/2009   Method used:   Electronically to        [id number] (retail)       [address]  [place], [place]  [address]       Ph: [phone] or [phone]       Fax: [phone]   RxID:   [id number] FUROSEMIDE 40 MG TABS (FUROSEMIDE) take two tablets tabs twice a day  #120 x 12   Entered by:   Minerva Areola, RN, BSN   Authorized by:   [name], PA-C   Signed by:   Minerva Areola, RN, BSN on 11/16/2009   Method used:   Electronically to        [id number] (retail)       [address]       [place], [place]  [address]       Ph: [phone] or [phone]       Fax: [phone]   RxID:   [id number]

## 2010-10-10 NOTE — Medication Information (Signed)
Summary: Coumadin Clinic  Anticoagulant Therapy  Managed by: Cloyde Reams, RN, BSN Referring MD: Jens Som MD, Arlys John PCP: Dr. Doran Durand Supervising MD: Johney Frame MD, Fayrene Fearing Indication 1: Atrial Fibrillation Lab Used: [facility]  [place] Site: [address] PT 21.7 INR POC 1.8 INR RANGE 2.0-3.0    Bleeding/hemorrhagic complications: no     Any changes in medication regimen? no     Any missed doses?: no       Is patient compliant with meds? yes       Allergies: No Known Drug Allergies  Anticoagulation Management History:      Her anticoagulation is being managed by telephone today.  Positive risk factors for bleeding include an age of 89 years or older, history of CVA/TIA, and presence of serious comorbidities.  The bleeding index is 'high risk'.  Positive CHADS2 values include History of CHF, History of HTN, Age > 87 years old, History of Diabetes, and Prior Stroke/CVA/TIA.  Her last INR was 3.69.  Prothrombin time is 21.7.  Anticoagulation responsible provider: Jissell Trafton MD, Fayrene Fearing.  INR POC: 1.8.    Anticoagulation Management Assessment/Plan:      The patient's current anticoagulation dose is Warfarin sodium 2 mg tabs: as directed.  The target INR is 2.0-3.0.  The next INR is due 12/07/2009.  Anticoagulation instructions were given to [facility], RN.  Results were reviewed/authorized by Cloyde Reams, RN, BSN.  She was notified by Cloyde Reams RN.         Prior Anticoagulation Instructions: INR 1.5  Spoke with Elnita Maxwell, [facility], RN while at pt's home.  Pt has been taking 4mg  daily except 2mg  on Fri,Su,M.  Advised to have pt take 5mg  today then start taking 4mg  daily except 2mg  on Mondays and Fridays.  Recheck in 1 week.    Current Anticoagulation Instructions: INR 1.8  Attempted to call pt with results.  LMOM TCB for results. Cloyde Reams RN  November 30, 2009 2:20 PM  Spoke with Lewayne Bunting, pt's daughter advised to have pt take 2.5 tablets today then incr dosage  to 2 tablets daily except 1 tablet on Mondays.  Recheck in 1 week.  Hattie Perch, [facility], RN verbal orders given to recheck PT/INR on 12/07/09.

## 2010-10-10 NOTE — Assessment & Plan Note (Signed)
Summary: f1w   Visit Type:  wk f/u Referring Provider:  [facility] Primary Provider:  Dr. Doran Durand   History of Present Illness: this is an 77 year old, African American female, patient, who saw Dr. Smitty Cords Birdie last week for increased end diastolic heart failure. He increased her furosemide and was hoping she lose 78 pounds over the next week. She is doing better and has lost about 5 pounds. She still has significant leg edema. She gets around with a walker and has dyspnea on exertion. The family states that she only eats fresh food and gets very low salt in her diet. The patient has a long history of diastolic heart failure with marked LVH by echo an ejection fraction of 55%. She has previous CABG and chronic renal insufficiency with creatinines in the 220 range, but it was 1.2, only checked it last week. Her BNP was elevated at 2379. She also has atrial fibrillation and is on Coumadin.  Problems Prior to Update: 1)  Renal Insufficiency  (ICD-588.9) 2)  Pacemaker  (ICD-V45.Marland Kitchen01) 3)  Pacemaker  (ICD-V45.Marland Kitchen01) 4)  Bradycardia-tachycardia Syndrome  (ICD-427.81) 5)  Dyspnea  (ICD-786.05) 6)  Essential Hypertension, Benign  (ICD-401.1) 7)  Cva  (ICD-434.91) 8)  Dyslipidemia  (ICD-272.4) 9)  Dm  (ICD-250.00) 10)  CHF  (ICD-428.0) 11)  Pulmonary Hypertension  (ICD-416.8) 12)  Cad  (ICD-414.00)  Current Medications (verified): 1)  Aspirin 81 Mg  Tabs (Aspirin) .... Daily 2)  Metoprolol Succinate 100 Mg Xr24h-Tab (Metoprolol Succinate) .... One Daily 3)  Zocor 20 Mg Tabs (Simvastatin) .Marland Kitchen.. 1 By Mouth Daily 4)  Niacin Cr 500 Mg Cr-Tabs (Niacin) .... Qhs 5)  Lantus .... As Directed 6)  Acetaminophen-Codeine #2 300-15 Mg Tabs (Acetaminophen-Codeine) .... As Needed 7)  Citalopram Hydrobromide 20 Mg Tabs (Citalopram Hydrobromide) .... One Pill For Mood 8)  Aricept 5 Mg Tabs (Donepezil Hcl) .... Two Tabs At Night Time 9)  Nova Max Glucose Test  Strp (Glucose Blood) .... Check Sugar Two Times A  Day 10)  Acetaminophen 325 Mg  Tabs (Acetaminophen) .... As Needed 11)  Furosemide 40 Mg Tabs (Furosemide) .... Take Two Tablets Tabs Every Morning and One Tab Every Evening 12)  Vitamin D3 1000 Unit Tabs (Cholecalciferol) .Marland Kitchen.. 1 By Mouth Daily 13)  Klor-Con M10 10 Meq Cr-Tabs (Potassium Chloride Crys Cr) .Marland Kitchen.. 1 By Mouth Daily 14)  Warfarin Sodium 2 Mg Tabs (Warfarin Sodium) .... As Directed 15)  Claritin 10 Mg Tabs (Loratadine) .Marland Kitchen.. 1 By Mouth Daily 16)  Lisinopril 10 Mg Tabs (Lisinopril) .Marland Kitchen.. 1 By Mouth Daily 17)  Niacin 500 Mg Tabs (Niacin) .Marland Kitchen.. 1 Tab Once Daily  Allergies (verified): No Known Drug Allergies  Past History:  Past Medical History: Last updated: 09/30/2009  1. Coronary disease.   2. Pulmonary hypertension.   3. Congestive heart failure.   4. Ejection fraction of 45% per catheterization  in May and mild       diastolic dysfunction.   5. Diabetes.   6. Dyslipidemia.   7. CVA.   8. Tachybradycardia syndrome status post permanent pacemaker placement     Review of Systems       see history of present illness  Vital Signs:  Patient profile:   76 year old female Height:      67 inches Weight:      169 pounds Pulse rate:   66 / minute BP sitting:   157 / 71  (left arm) Cuff size:   large  Vitals Entered By: Clydie Braun  Graham-Oliver (November 09, 2009 12:24 PM)  Physical Exam  General:  Elderly, in no acute distress. Neck: slight JVD, HJR, no Bruit, or thyroid enlargement Lungs: decreased breath sounds at the bases, otherwise clear without rales, or rhonchi Cardiovascular: RRR, PMI not displaced, positive S4, and 2/6 systolic murmur in the left sternal border and apex, no bruit, thrill, or heave. Abdomen: BS normal. Soft without organomegaly, masses, lesions or tenderness. Extremities: +2-3 bilateral lower extremity edema weeping. Left leg wrapped. decreased distal pulses bilateral SKin: Warm, no lesions or rashes  Musculoskeletal: No deformities Neuro: no focal  signs    PPM Specifications Following MD:  Hillis Range, MD     PPM Vendor:  [employer]onic     PPM Model Number:  ADDR01     PPM Serial Number:  [id number] PPM DOI:  09/06/2009     PPM Implanting MD:  Hillis Range, MD  Lead 1    Location: RA     DOI: 09/06/2009     Model #: [id number]     Serial #: [id number]     Status: active Lead 2    Location: RV     DOI: 09/06/2009     Model #: [id number]     Serial #: [id number]     Status: active  Magnet Response Rate:  BOL 85 ERI  65  Indications:  Sick sinus syndrome   PPM Follow Up Pacer Dependent:  No      Episodes Coumadin:  Yes  Parameters Mode:  DDDR+     Lower Rate Limit:  60     Upper Rate Limit:  130 Paced AV Delay:  150     Sensed AV Delay:  120  Impression & Recommendations:  Problem # 1:  DIASTOLIC HEART FAILURE, ACUTE ON CHRONIC (ICD-428.33) Patient continues to have heart failure. She has lost 5 pounds, but has about 9 more pounds to lose. Her creatinine was stable so we will continue Lasix at her current dose. I asked her to keep her legs elevated during the day. We will check her in one week. Her updated medication list for this problem includes:    Aspirin 81 Mg Tabs (Aspirin) .Marland Kitchen... Daily    Metoprolol Succinate 100 Mg Xr24h-tab (Metoprolol succinate) ..... One daily    Furosemide 40 Mg Tabs (Furosemide) .Marland Kitchen... Take two tablets tabs every morning and one tab every evening    Warfarin Sodium 2 Mg Tabs (Warfarin sodium) .Marland Kitchen... As directed    Lisinopril 10 Mg Tabs (Lisinopril) .Marland Kitchen... 1 by mouth daily  Problem # 2:  RENAL INSUFFICIENCY (ICD-588.9) creatinine was stable last week. We'll recheck it next week  Patient Instructions: 1)  Your physician recommends that you schedule a follow-up appointment in: next week or next available with Dr. Antoine Poche . 2)  Your physician recommends that you return for lab work XL:KGMW week BMP, BNP. 427.31, 414.04 Prescriptions: FUROSEMIDE 40 MG TABS (FUROSEMIDE) take two tablets tabs every morning and one  tab every evening  #90 x 6   Entered by:   Ollen Gross, RN, BSN   Authorized by:   [name], PA-C   Signed by:   Ollen Gross, RN, BSN on 11/09/2009   Method used:   Electronically to        [id number] (retail)       [address]       [place], [place]  [address]  Ph: [phone] or [phone]       Fax: [phone]   RxID:   [id number]

## 2010-10-10 NOTE — Miscellaneous (Signed)
Summary: [facility]  [facility]   Imported By: Roderic Ovens 01/04/2010 16:40:24  _____________________________________________________________________  External Attachment:    Type:   Image     Comment:   External Document

## 2010-10-10 NOTE — Miscellaneous (Signed)
Summary: dx correction  Clinical Lists Changes  Problems: Changed problem from PACEMAKER (ICD-V45.Marland Kitchen01) to PACEMAKER, PERMANENT (ICD-V45.01) Changed problem from PACEMAKER (ICD-V45.Marland Kitchen01) to PACEMAKER, PERMANENT (ICD-V45.01)  changed the incorrect dx code to correct dx code Genella Mech  July 07, 2010 12:50 PM

## 2010-10-10 NOTE — Assessment & Plan Note (Signed)
Summary: 2wk f/u per pa michelle   Visit Type:  Follow-up Primary Provider:  Dr. Doran Durand  CC:  CHF.  History of Present Illness: The patient presents for evaluation of heart failure and lower extremity edema. She has been seen twice by our physician's assistant. She has had a very elevated BNP and weeping lower extremity edema. She has had her diuretic dose adjusted. Her weights have been steadily coming down over the past several weeks. She is having a nurse dress her legs. Her family reports that they are much improved. She has no weeping edema. She says her breathing is improved. She is very sedentary sitting most days and her easy chair. She is not describing any new PND or orthopnea. She's not having any new palpitations, presyncope or syncope. She's had no chest pain.  Current Medications (verified): 1)  Aspirin 81 Mg  Tabs (Aspirin) .... Daily 2)  Metoprolol Succinate 100 Mg Xr24h-Tab (Metoprolol Succinate) .... One Daily 3)  Zocor 20 Mg Tabs (Simvastatin) .Marland Kitchen.. 1 By Mouth Daily 4)  Niacin Cr 500 Mg Cr-Tabs (Niacin) .... Qhs 5)  Lantus 100 Unit/ml Soln (Insulin Glargine) .... As Directed 6)  Acetaminophen-Codeine #2 300-15 Mg Tabs (Acetaminophen-Codeine) .... As Needed 7)  Citalopram Hydrobromide 20 Mg Tabs (Citalopram Hydrobromide) .... One Pill For Mood 8)  Aricept 5 Mg Tabs (Donepezil Hcl) .... Two Tabs At Night Time 9)  Nova Max Glucose Test  Strp (Glucose Blood) .... Check Sugar Two Times A Day 10)  Acetaminophen 325 Mg  Tabs (Acetaminophen) .... As Needed 11)  Furosemide 40 Mg Tabs (Furosemide) .... Take Two Tablets Tabs Twice A Day 12)  Vitamin D3 1000 Unit Tabs (Cholecalciferol) .Marland Kitchen.. 1 By Mouth Daily 13)  Klor-Con M10 10 Meq Cr-Tabs (Potassium Chloride Crys Cr) .... Take Two By Mouth Twice A Day 14)  Warfarin Sodium 2 Mg Tabs (Warfarin Sodium) .... As Directed 15)  Claritin 10 Mg Tabs (Loratadine) .Marland Kitchen.. 1 By Mouth Daily 16)  Lisinopril 10 Mg Tabs (Lisinopril) .Marland Kitchen.. 1 By  Mouth Daily  Allergies (verified): No Known Drug Allergies  Past History:  Past Medical History:  1. Coronary disease.   2. Pulmonary hypertension.   3. Congestive heart failure.   4. Ejection fraction of 45% mild  diastolic dysfunction.   5. Diabetes.   6. Dyslipidemia.   7. CVA.   8. Tachybradycardia syndrome status post permanent pacemaker placement     Vital Signs:  Patient profile:   77 year old female Height:      67 inches Weight:      155 pounds Pulse rate:   65 / minute Resp:     16 per minute BP sitting:   132 / 68  (right arm)  Vitals Entered By: Marrion Coy, CNA (December 07, 2009 11:14 AM)  Physical Exam  General:  Well developed, well nourished, in no acute distress. Head:  normocephalic and atraumatic Neck:  Neck supple, no JVD. No masses, thyromegaly or abnormal cervical nodes. Lungs:  Clear bilaterally to auscultation and percussion. Heart:  S1 and S2 within normal limits, no S3, no S4, no clicks, no rubs Abdomen:  Bowel sounds positive; abdomen soft and non-tender without masses, organomegaly, or hernias noted. No hepatosplenomegaly. Msk:  Back normal, normal gait. Muscle strength and tone normal. Pulses:  absent dorsalis pedis or posterior tibialis bilaterally Extremities:  Moderately severe bilateral lower extremity edema to above the knees Neurologic:  Alert and oriented x 3. Psych:  Normal affect.   PPM Specifications  Following MD:  Rollene Rotunda, MD     PPM Vendor:  [employer]     PPM Model Number:  ADDR01     PPM Serial Number:  [id number] PPM DOI:  09/06/2009     PPM Implanting MD:  Hillis Range, MD  Lead 1    Location: RA     DOI: 09/06/2009     Model #: 9528     Serial #: [id number]     Status: active Lead 2    Location: RV     DOI: 09/06/2009     Model #: [id number]     Serial #: [id number]     Status: active  Magnet Response Rate:  BOL 85 ERI  65  Indications:  Sick sinus syndrome   PPM Follow Up Remote Check?  No Battery Voltage:  2.80  V     Battery Est. Longevity:  8.5 YEARS     Pacer Dependent:  No       PPM Device Measurements Atrium  Amplitude: 5.6 mV, Impedance: 542 ohms, Threshold: 0.75 V at 0.4 msec Right Ventricle  Amplitude: 8 mV, Impedance: 529 ohms, Threshold: 0.5 V at 0.4 msec  Episodes MS Episodes:  0     Coumadin:  Yes Ventricular High Rate:  0     Atrial Pacing:  97.8%     Ventricular Pacing:  0.6%  Parameters Mode:  MVP (R)     Lower Rate Limit:  60     Upper Rate Limit:  130 Paced AV Delay:  150     Sensed AV Delay:  120 Next Cardiology Appt Due:  08/10/2010 Tech Comments:  Normal device function.  RA and RV autocapture turned on today.  Rate response also adjusted by taking ADL response from 3 to 4.  No other changes made.  ROV 12-11 with Dr Antoine Poche for year anniversary.Gypsy Balsam RN BSN  December 07, 2009 11:28 AM   Impression & Recommendations:  Problem # 1:  BRADYCARDIA-TACHYCARDIA SYNDROME (ICD-427.81) The patient maintains Coumadin and will have this followed today with an INR. She had her pacemaker interrogated today and has normal function as described above.  Problem # 2:  DIASTOLIC HEART FAILURE, ACUTE ON CHRONIC (ICD-428.33) I will check a basic metabolic profile today. Symptomatically she is much improved. I would be hesitant to push her diuretics further. Rather I had a long conversation with her daughters about keeping her feet elevated as very important towards her progress. I think they can comply with this.  Problem # 3:  RENAL INSUFFICIENCY (ICD-588.9) This will be checked today as described above.  Problem # 4:  ESSENTIAL HYPERTENSION, BENIGN (ICD-401.1) Her blood pressure is controlled and she will continue the meds as listed.  Other Orders: EKG w/ Interpretation (93000)  Patient Instructions: 1)  Your physician recommends that you schedule a follow-up appointment in: 6 WEEKS WITH DR [facility] 2)  Your physician recommends that you continue on your current medications as  directed. Please refer to the Current Medication list given to you today.

## 2010-10-10 NOTE — Miscellaneous (Signed)
Summary: [facility]   [facility]   Imported By: Roderic Ovens 02/15/2010 16:37:23  _____________________________________________________________________  External Attachment:    Type:   Image     Comment:   External Document

## 2010-10-10 NOTE — Assessment & Plan Note (Signed)
Summary: per check out/sf   Visit Type:  Follow-up Primary Provider:  Dr. Doran Durand  CC:  Diastolic HF.  History of Present Illness: The patient presents for followup after a recent lengthy hospitalization. She was admitted with volume overload. However, after diuresis she developed progressive renal insufficiency. Her hospitalization was also complicated by tachycardia bradycardia syndrome which eventually required a pacemaker. She was also very difficult to rehabilitation as she found it quite fatiguing to work with physical therapy or occupational therapy. We finally were able to get her to the point where she could go home with her daughter with home physical therapy. She now presents for followup. She did have labs drawn recently with a creatinine of 1.99.  Her daughter reports that she is ambulating better than anticipated and rehabilitation. She is not having any new shortness of breath. She denies any PND or orthopnea. She has had no chest pressure, neck or arm discomfort. She's had no palpitations, presyncope or syncope. She does continue to have lower extremity swelling but she is trying to keep her feet elevated.  Current Medications (verified): 1)  Aspirin 81 Mg  Tabs (Aspirin) .... Daily 2)  Metoprolol Tartrate 25 Mg Tabs (Metoprolol Tartrate) .Marland Kitchen.. 1 By Mouth Three Times A Day 3)  Zocor 20 Mg Tabs (Simvastatin) .Marland Kitchen.. 1 By Mouth Daily 4)  Niacin Cr 500 Mg Cr-Tabs (Niacin) .... Qhs 5)  Lantus .... As Directed 6)  Acetaminophen-Codeine #2 300-15 Mg Tabs (Acetaminophen-Codeine) .... As Needed 7)  Citalopram Hydrobromide 20 Mg Tabs (Citalopram Hydrobromide) .... One Pill For Mood 8)  Aricept 5 Mg Tabs (Donepezil Hcl) .... Two Tabs At Night Time 9)  Nova Max Glucose Test  Strp (Glucose Blood) .... Check Sugar Two Times A Day 10)  Acetaminophen 325 Mg  Tabs (Acetaminophen) .... As Needed 11)  Furosemide 20 Mg Tabs (Furosemide) .... 3 By Mouth Two Times A Day 12)  Vitamin D3 1000 Unit  Tabs (Cholecalciferol) .Marland Kitchen.. 1 By Mouth Daily 13)  Klor-Con M10 10 Meq Cr-Tabs (Potassium Chloride Crys Cr) .Marland Kitchen.. 1 By Mouth Daily 14)  Warfarin Sodium 2 Mg Tabs (Warfarin Sodium) .... As Directed 15)  Claritin 10 Mg Tabs (Loratadine) .Marland Kitchen.. 1 By Mouth Daily 16)  Lisinopril 10 Mg Tabs (Lisinopril) .Marland Kitchen.. 1 By Mouth Daily  Allergies (verified): No Known Drug Allergies  Past History:  Past Medical History:  1. Coronary disease.   2. Pulmonary hypertension.   3. Congestive heart failure.   4. Ejection fraction of 45% per catheterization  in May and mild       diastolic dysfunction.   5. Diabetes.   6. Dyslipidemia.   7. CVA.   8. Tachybradycardia syndrome status post permanent pacemaker placement     Review of Systems       As stated in the HPI and negative for all other systems.   Vital Signs:  Patient profile:   77 year old female Height:      67 inches Weight:      158 pounds Resp:     16 per minute BP sitting:   144 / 74  (right arm)  Vitals Entered By: Marrion Coy, CNA (September 30, 2009 11:35 AM)  Physical Exam  General:  Well developed, well nourished, in no acute distress. Head:  normocephalic and atraumatic Mouth:  Edentulous, gums and palate normal. Oral mucosa normal. Neck:  Neck supple, no JVD. No masses, thyromegaly or abnormal cervical nodes. Chest Wall:  no deformities or breast masses noted, well  healed sternotomy scar, well-healed pacemaker pocket Lungs:  Clear bilaterally to auscultation and percussion. Heart:  PMI displaced and somewhat sustained laterally, chest non-tender; regular rate and rhythm, S1, S2 without murmurs, rubs or gallops. Carotid upstroke normal, no bruit. Normal abdominal aortic size, no bruits. Femorals normal pulses, no bruits. Pedals normal pulses. No varicosities. Abdomen:  without guarding and without rebound.  Distended. Msk:  Back normal, normal gait. Muscle strength and tone normal. Extremities:  3+ left pedal edema and 3+ right  pedal edema.   Neurologic:  Alert and oriented x 3. Skin:  Intact without lesions or rashes. Cervical Nodes:  no significant adenopathy Axillary Nodes:  no significant adenopathy Inguinal Nodes:  no significant adenopathy Psych:  poor memory.     EKG  Procedure date:  09/30/2009  Findings:      sinus rhythm, rate 69, axis within normal limits, intervals within normal limits, poor anterior R-wave progression, old anteroseptal infarct, lateral T-wave inversions unchanged from previous.  PPM Specifications Following MD:  Hillis Range, MD     PPM Vendor:  [employer]     PPM Model Number:  ADDR01     PPM Serial Number:  [id number] [facility] PPM DOI:  09/06/2009     PPM Implanting MD:  Hillis Range, MD  Lead 1    Location: RA     DOI: 09/06/2009     Model #: 4098     Serial #: [id number]     Status: active Lead 2    Location: RV     DOI: 09/06/2009     Model #: 6213     Serial #: [id number]     Status: active  Magnet Response Rate:  BOL 85 ERI  65  Indications:  Sick sinus syndrome   PPM Follow Up Remote Check?  No Battery Voltage:  2.80 V     Battery Est. Longevity:  9.5 years     Pacer Dependent:  No       PPM Device Measurements Atrium  Amplitude: 2.8 mV, Impedance: 559 ohms, Threshold: 0.625 V at 0.4 msec Right Ventricle  Amplitude: 5.6 mV, Impedance: 528 ohms, Threshold: 0.5 V at 0.4 msec  Episodes MS Episodes:  1     Percent Mode Switch:  <0.1%     Coumadin:  Yes Ventricular High Rate:  0     Atrial Pacing:  10.8%     Ventricular Pacing:  0.5%  Parameters Mode:  DDDR+     Lower Rate Limit:  60     Upper Rate Limit:  130 Paced AV Delay:  150     Sensed AV Delay:  120 Next Cardiology Appt Due:  12/09/2009 Tech Comments:  Steri strips removed, no redness or edema.  Device function normal.  ROV 3 months Dr. Juanda Chance. Altha Harm, LPN  September 30, 2009 12:01 PM   Impression & Recommendations:  Problem # 1:  ESSENTIAL HYPERTENSION, BENIGN (ICD-401.1) Her blood pressure is still not  well controlled. I will increase her Toprol to 100 mg XL daily which will also be easier to take.  Problem # 2:  DYSPNEA (ICD-786.05) This is improved. It is related in part to diastolic heart failure. Therefore, control of her blood pressure is essential.  Problem # 3:  CAD (ICD-414.00) She is having no new symptoms. No further cardiovascular testing is suggested.  Problem # 4:  BRADYCARDIA-TACHYCARDIA SYNDROME (ICD-427.81) She is tolerating Coumadin. She had her pacemaker checked today. The results are as above. She will continue with routine  followup.  Patient Instructions: 1)  Your physician recommends that you schedule a follow-up appointment in: 4 months with Dr Antoine Poche and Dr Charlies Constable in April 2011 2)  Your physician recommends that you continue on your current medications as directed. Please refer to the Current Medication list given to you today. Prescriptions: METOPROLOL SUCCINATE 100 MG XR24H-TAB (METOPROLOL SUCCINATE) one daily  #30 x 11   Entered by:   Charolotte Capuchin, RN   Authorized by:   Rollene Rotunda, MD, [facility]   Signed by:   Charolotte Capuchin, RN on 09/30/2009   Method used:   Electronically to        [id number] (retail)       [address]       [place], [place]  [address]       Ph: [phone] or [phone]       Fax: [phone]   RxID:   [id number]

## 2010-10-10 NOTE — Assessment & Plan Note (Signed)
Summary: very weak no energy edema in ankles   Visit Type:  Follow-up Primary Provider:  Dr. Doran Durand  CC:  CHF.  History of Present Illness: The patient presents for followup of her known cardiomyopathy. She has an EF of about 45%. Since I last saw her she continues to do well. I think this is because her family is so vigilant and watching her volume status. She gained weight last week in a restricted her salt and it has come back down. She is trying to keep her feet elevated. She's trying to be active. She's had no new shortness of breath, PND or orthopnea. She's had no palpitations, presyncope or syncope. She's had no chest pressure, neck or arm discomfort.  Current Medications (verified): 1)  Aspirin 81 Mg  Tabs (Aspirin) .... Daily 2)  Metoprolol Succinate 100 Mg Xr24h-Tab (Metoprolol Succinate) .... One Daily 3)  Zocor 20 Mg Tabs (Simvastatin) .Marland Kitchen.. 1 By Mouth Daily 4)  Niacin Cr 500 Mg Cr-Tabs (Niacin) .... Qhs 5)  Lantus 100 Unit/ml Soln (Insulin Glargine) .... As Directed 6)  Acetaminophen-Codeine #2 300-15 Mg Tabs (Acetaminophen-Codeine) .... As Needed 7)  Citalopram Hydrobromide 20 Mg Tabs (Citalopram Hydrobromide) .... One Pill For Mood 8)  Aricept 5 Mg Tabs (Donepezil Hcl) .... Two Tabs At Night Time 9)  Nova Max Glucose Test  Strp (Glucose Blood) .... Check Sugar Two Times A Day 10)  Acetaminophen 325 Mg  Tabs (Acetaminophen) .... As Needed 11)  Furosemide 40 Mg Tabs (Furosemide) .... Take Two Tablets Tabs Twice A Day 12)  Vitamin D3 1000 Unit Tabs (Cholecalciferol) .Marland Kitchen.. 1 By Mouth Daily 13)  Klor-Con M10 10 Meq Cr-Tabs (Potassium Chloride Crys Cr) .... Take Two By Mouth Twice A Day 14)  Warfarin Sodium 2 Mg Tabs (Warfarin Sodium) .... As Directed 15)  Claritin 10 Mg Tabs (Loratadine) .Marland Kitchen.. 1 By Mouth Daily 16)  Lisinopril 10 Mg Tabs (Lisinopril) .Marland Kitchen.. 1 By Mouth Daily  Allergies (verified): No Known Drug Allergies  Past History:  Past Medical History: Reviewed  history from 12/07/2009 and no changes required.  1. Coronary disease.   2. Pulmonary hypertension.   3. Congestive heart failure.   4. Ejection fraction of 45% mild  diastolic dysfunction.   5. Diabetes.   6. Dyslipidemia.   7. CVA.   8. Tachybradycardia syndrome status post permanent pacemaker placement     Past Surgical History: Reviewed history from 05/12/2009 and no changes required.  1. CABG.   2. Total hip arthroplasty.   3. Aneurysm repair.   Review of Systems       As stated in the HPI and negative for all other systems.   Vital Signs:  Patient profile:   77 year old female Height:      67 inches Weight:      146 pounds BMI:     22.95 Pulse rate:   83 / minute Resp:     16 per minute BP sitting:   158 / 62  (right arm)  Vitals Entered By: Marrion Coy, CNA (February 09, 2010 3:33 PM)  Physical Exam  General:  Well developed, well nourished, in no acute distress. Head:  normocephalic and atraumatic Neck:  Neck supple, no JVD. No masses, thyromegaly or abnormal cervical nodes. Chest Wall:  no deformities or breast masses noted, well healed sternotomy scar, well-healed pacemaker pocket Lungs:  Clear bilaterally to auscultation and percussion. Heart:  S1 and S2 within normal limits, no S3, no S4, no clicks, no  rubs Abdomen:  Bowel sounds positive; abdomen soft and non-tender without masses, organomegaly, or hernias noted. No hepatosplenomegaly. Msk:  Back normal, normal gait. Muscle strength and tone normal. Pulses:  absent dorsalis pedis or posterior tibialis bilaterally Extremities:  Mild bilateral lower extremity edema to above the knees Neurologic:  Alert and oriented x 3. Skin:  Intact without lesions or rashes. Cervical Nodes:  no significant adenopathy Axillary Nodes:  no significant adenopathy Inguinal Nodes:  no significant adenopathy Psych:  Normal affect.   PPM Specifications Following MD:  Rollene Rotunda, MD     PPM Vendor:  [employer]     PPM Model  Number:  ADDR01     PPM Serial Number:  [id number] PPM DOI:  09/06/2009     PPM Implanting MD:  Hillis Range, MD  Lead 1    Location: [facility]     DOI: 09/06/2009     Model #: [id number]     Serial #: [id number]     Status: active Lead 2    Location: RV     DOI: 09/06/2009     Model #: [id number]     Serial #: [id number]     Status: active  Magnet Response Rate:  BOL 85 ERI  65  Indications:  Sick sinus syndrome   PPM Follow Up Pacer Dependent:  No      Episodes Coumadin:  Yes  Parameters Mode:  MVP (R)     Lower Rate Limit:  60     Upper Rate Limit:  130 Paced AV Delay:  150     Sensed AV Delay:  120  Impression & Recommendations:  Problem # 1:  DIASTOLIC HEART FAILURE, ACUTE ON CHRONIC (ICD-428.33) The patient has had no recurrent dyspnea similar to the complaints that brought her to the hospital late last year. She has mild lower extremity swelling which is well cared for by her family. Therefore, this time I will make no change to her therapy. She will continue with meds as listed. I will check a basic metabolic profile today. Orders: TLB-BMP (Basic Metabolic Panel-BMET) (80048-METABOL)  Problem # 2:  CORONARY ATHEROSCLEROSIS OF ARTERY BYPASS GRAFT (ICD-414.04) She has no new symptoms. No further cardiovascular testing is suggested.  Problem # 3:  RENAL INSUFFICIENCY (ICD-588.9) I will check labs as above. Orders: TLB-BMP (Basic Metabolic Panel-BMET) (80048-METABOL)  Problem # 4:  ATRIAL FIBRILLATION (ICD-427.31) She continues to tolerate aspirin and therapeutic Coumadin. Orders: EKG w/ Interpretation (93000)  Patient Instructions: 1)  Your physician recommends that you schedule a follow-up appointment in: 6 months with Dr Antoine Poche 2)  Your physician recommends that you continue on your current medications as directed. Please refer to the Current Medication list given to you today.

## 2010-10-10 NOTE — Medication Information (Signed)
Summary: [facility]  Anticoagulant Therapy  Managed by: Cloyde Reams, RN, BSN Referring MD: Jens Som MD, Arlys John PCP: Dr. Doran Durand Supervising MD: Antoine Poche MD, Fayrene Fearing Indication 1: Atrial Fibrillation Lab Used: [facility] [place] Site: [address] INR POC 2.0 INR RANGE 2.0-3.0  Dietary changes: no    Health status changes: no    Bleeding/hemorrhagic complications: no    Recent/future hospitalizations: no    Any changes in medication regimen? no    Recent/future dental: no  Any missed doses?: no       Is patient compliant with meds? yes       Allergies: No Known Drug Allergies  Anticoagulation Management History:      The patient is taking warfarin and comes in today for a routine follow up visit.  Positive risk factors for bleeding include an age of 75 years or older, history of CVA/TIA, and presence of serious comorbidities.  The bleeding index is 'high risk'.  Positive CHADS2 values include History of CHF, History of HTN, Age > 75 years old, History of Diabetes, and Prior Stroke/CVA/TIA.  Her last INR was 1.8 ratio.  Anticoagulation responsible provider: Antoine Poche MD, Fayrene Fearing.  INR POC: 2.0.  Cuvette Lot#: [id number].  Exp: 05/2011.    Anticoagulation Management Assessment/Plan:      The patient's current anticoagulation dose is Warfarin sodium 2 mg tabs: as directed.  The target INR is 2.0-3.0.  The next INR is due 04/14/2010.  Anticoagulation instructions were given to patient/daughter.  Results were reviewed/authorized by Cloyde Reams, RN, BSN.  She was notified by Cloyde Reams RN.         Prior Anticoagulation Instructions: INR 2.2 Continue 2 pills everyday except 1 pill on Sundays. Recheck in 4 weeks.   Current Anticoagulation Instructions: INR 2.0  Continue on same dosage 2 tablets daily except 1 tablet Sundays.  Recheck in 4 weeks.

## 2010-10-10 NOTE — Assessment & Plan Note (Signed)
Summary: pa f/u sl   Visit Type:  Follow-up Referring Provider:  [facility] Primary Provider:  Dr. Doran Durand   History of Present Illness: This is an 77 year old, African American, female, who I saw the ast 2 week for increased diastolic heart failure. She had not diuresed well, and I increased her Lasix to 80 mg b.i.d., and place her on 2 g sodium diet. Her BNP was actually 1652 last week. She has actually lost 4 pounds this week and is feeling better. She does not get short of breath with activity as she had been. She continues to have leg edema, but overall feels much better. She denies further chest tightness.  Current Medications (verified): 1)  Aspirin 81 Mg  Tabs (Aspirin) .... Daily 2)  Metoprolol Succinate 100 Mg Xr24h-Tab (Metoprolol Succinate) .... One Daily 3)  Zocor 20 Mg Tabs (Simvastatin) .Marland Kitchen.. 1 By Mouth Daily 4)  Niacin Cr 500 Mg Cr-Tabs (Niacin) .... Qhs 5)  Lantus 100 Unit/ml Soln (Insulin Glargine) .... As Directed 6)  Acetaminophen-Codeine #2 300-15 Mg Tabs (Acetaminophen-Codeine) .... As Needed 7)  Citalopram Hydrobromide 20 Mg Tabs (Citalopram Hydrobromide) .... One Pill For Mood 8)  Aricept 5 Mg Tabs (Donepezil Hcl) .... Two Tabs At Night Time 9)  Nova Max Glucose Test  Strp (Glucose Blood) .... Check Sugar Two Times A Day 10)  Acetaminophen 325 Mg  Tabs (Acetaminophen) .... As Needed 11)  Furosemide 40 Mg Tabs (Furosemide) .... Take Two Tablets Tabs Twice A Day 12)  Vitamin D3 1000 Unit Tabs (Cholecalciferol) .Marland Kitchen.. 1 By Mouth Daily 13)  Klor-Con M10 10 Meq Cr-Tabs (Potassium Chloride Crys Cr) .... Take Two By Mouth Twice A Day 14)  Warfarin Sodium 2 Mg Tabs (Warfarin Sodium) .... As Directed 15)  Claritin 10 Mg Tabs (Loratadine) .Marland Kitchen.. 1 By Mouth Daily 16)  Lisinopril 10 Mg Tabs (Lisinopril) .Marland Kitchen.. 1 By Mouth Daily 17)  Niacin 500 Mg Tabs (Niacin) .Marland Kitchen.. 1 Tab Once Daily  Allergies (verified): No Known Drug Allergies  Past History:  Past Medical History: Last  updated: 09/30/2009  1. Coronary disease.   2. Pulmonary hypertension.   3. Congestive heart failure.   4. Ejection fraction of 45% per catheterization  in May and mild       diastolic dysfunction.   5. Diabetes.   6. Dyslipidemia.   7. CVA.   8. Tachybradycardia syndrome status post permanent pacemaker placement     Review of Systems       see history of present illness  Vital Signs:  Patient profile:   77 year old female Height:      67 inches Weight:      163 pounds Pulse rate:   72 / minute Pulse rhythm:   regular Resp:     18 per minute BP sitting:   116 / 60  (left arm) Cuff size:   large  Vitals Entered By: Vikki Ports (November 23, 2009 11:39 AM)  Physical Exam  General:   Well-nournished, in no acute distress. Neck: Slight increase JVD, HJR but improved from last week, No Bruit, or thyroid enlargement Lungs: Decreased breath sounds at the bases,No tachypnea, clear without wheezing, rales, or rhonchi Cardiovascular: RRR, PMI not displaced, 2/6 systolic murmur at the lateral border and apex, no bruit, thrill, or heave. Abdomen: BS normal. Soft without organomegaly, masses, lesions or tenderness. Extremities: +2 bilateral edema up to her knees improved from last week,without cyanosis, clubbing  Good distal pulses bilateral SKin: Warm, no lesions or rashes  Musculoskeletal: No deformities Neuro: no focal signs    PPM Specifications Following MD:  Hillis Range, MD     PPM Vendor:  [employer]     PPM Model Number:  ADDR01     PPM Serial Number:  [id number] PPM DOI:  09/06/2009     PPM Implanting MD:  Hillis Range, MD  Lead 1    Location: RA     DOI: 09/06/2009     Model #: [id number]     Serial #: [id number]     Status: active Lead 2    Location: RV     DOI: 09/06/2009     Model #: [id number]     Serial #: [id number]     Status: active  Magnet Response Rate:  BOL 85 ERI  65  Indications:  Sick sinus syndrome   PPM Follow Up Pacer Dependent:  No      Episodes Coumadin:   Yes  Parameters Mode:  DDDR+     Lower Rate Limit:  60     Upper Rate Limit:  130 Paced AV Delay:  150     Sensed AV Delay:  120  Impression & Recommendations:  Problem # 1:  DIASTOLIC HEART FAILURE, ACUTE ON CHRONIC (ICD-428.33) Patient continues to have acute on chronic diastolic heart failure. She has improved since last week, and has lost 4 pounds. We will continue her current Lasix dose and followup labs in 2 weeks. Her updated medication list for this problem includes:    Aspirin 81 Mg Tabs (Aspirin) .Marland Kitchen... Daily    Metoprolol Succinate 100 Mg Xr24h-tab (Metoprolol succinate) ..... One daily    Furosemide 40 Mg Tabs (Furosemide) .Marland Kitchen... Take two tablets tabs twice a day    Warfarin Sodium 2 Mg Tabs (Warfarin sodium) .Marland Kitchen... As directed    Lisinopril 10 Mg Tabs (Lisinopril) .Marland Kitchen... 1 by mouth daily  Problem # 2:  CORONARY ATHEROSCLEROSIS OF ARTERY BYPASS GRAFT (ICD-414.04) Patient has had no further chest pain, and is feeling better with diuresis. Her updated medication list for this problem includes:    Aspirin 81 Mg Tabs (Aspirin) .Marland Kitchen... Daily    Metoprolol Succinate 100 Mg Xr24h-tab (Metoprolol succinate) ..... One daily    Warfarin Sodium 2 Mg Tabs (Warfarin sodium) .Marland Kitchen... As directed    Lisinopril 10 Mg Tabs (Lisinopril) .Marland Kitchen... 1 by mouth daily  Problem # 3:  RENAL INSUFFICIENCY (ICD-588.9) The patient's creatinine was 1.4. Last week. BUN 20. We will recheck these in 2 weeks  Patient Instructions: 1)  Your physician recommends that you schedule a follow-up appointment in: Keep scheduled appt with Dr. Antoine Poche on December 07, 2009 at 11:15 2)  Your physician recommends that you return for lab work on day of appt with Dr. Antoine Poche   BMP,BNP  [phone]

## 2010-10-10 NOTE — Medication Information (Signed)
Summary: [facility]  Anticoagulant Therapy  Managed by: Cloyde Reams, RN, BSN Referring MD: Jens Som MD, Arlys John PCP: Dr. Doran Durand Supervising MD: Riley Kill MD, Maisie Fus Indication 1: Atrial Fibrillation Lab Used: [facility] [place]  [place] Site: [address] PT 19.6 INR POC 2.6 INR RANGE 2.0-3.0    Bleeding/hemorrhagic complications: no     Any changes in medication regimen? no     Any missed doses?: no         Allergies: No Known Drug Allergies  Anticoagulation Management History:      Her anticoagulation is being managed by telephone today.  Positive risk factors for bleeding include an age of 21 years or older, history of CVA/TIA, and presence of serious comorbidities.  The bleeding index is 'high risk'.  Positive CHADS2 values include History of CHF, History of HTN, Age > 40 years old, History of Diabetes, and Prior Stroke/CVA/TIA.  Her last INR was 3.69.  Prothrombin time is 19.6.  Anticoagulation responsible provider: Riley Kill MD, Maisie Fus.  INR POC: 2.6.    Anticoagulation Management Assessment/Plan:      The patient's current anticoagulation dose is Warfarin sodium 2 mg tabs: as directed.  The target INR is 2.0-3.0.  The next INR is due 10/26/2009.  Anticoagulation instructions were given to pt's daughter.  Results were reviewed/authorized by Cloyde Reams, RN, BSN.  She was notified by Cloyde Reams RN.         Prior Anticoagulation Instructions: INR 3.69 blood draw Attempted to contact pt with results.  LMOM TCB. Cloyde Reams RN  October 10, 2009 4:26 PM  Called spoke with pt's daughter advised to have pt skip todays dosage of coumadin then start taking 1 tablet (2mg ) daily.  Recheck on 10/18/09.  Called [facility] spoke with Melissa gave verbal orders.  Current Anticoagulation Instructions: INR 2.6  Called spoke with pt's daughter advised to continue on same dosage 2mg  daily. Called spoke with Elnita Maxwell, [facility], RN.  Advised pt instructed to continue on to recheck  PT/INR on 10/26/09.

## 2010-10-10 NOTE — Letter (Signed)
[facility]   Imported By: Marylou Mccoy 12/22/2009 12:40:14  _____________________________________________________________________  External Attachment:    Type:   Image     Comment:   External Document

## 2010-10-10 NOTE — Medication Information (Signed)
Summary: [facility]  Anticoagulant Therapy  Managed by: Bethena Midget, RN, BSN Referring MD: Jens Som MD, Arlys John PCP: Dr. Doran Durand Supervising MD: Shirlee Latch MD, Dalton Indication 1: Atrial Fibrillation Lab Used: [facility] [place] Site: [address] PT 31.0 INR POC 3.0 INR RANGE 2.0-3.0  Dietary changes: yes       Details: Diet different while out of town  Health status changes: no    Bleeding/hemorrhagic complications: no    Recent/future hospitalizations: no    Any changes in medication regimen? no    Recent/future dental: no  Any missed doses?: no       Is patient compliant with meds? yes       Allergies: No Known Drug Allergies  Anticoagulation Management History:      Her anticoagulation is being managed by telephone today.  Positive risk factors for bleeding include an age of 75 years or older, history of CVA/TIA, and presence of serious comorbidities.  The bleeding index is 'high risk'.  Positive CHADS2 values include History of CHF, History of HTN, Age > 75 years old, History of Diabetes, and Prior Stroke/CVA/TIA.  Her last INR was 1.8 ratio.  Prothrombin time is 31.0.  Anticoagulation responsible provider: Shirlee Latch MD, [place].  INR POC: 3.0.    Anticoagulation Management Assessment/Plan:      The patient's current anticoagulation dose is Warfarin sodium 2 mg tabs: as directed.  The target INR is 2.0-3.0.  The next INR is due 07/06/2010.  Anticoagulation instructions were given to daughter.  Results were reviewed/authorized by Bethena Midget, RN, BSN.  She was notified by Bethena Midget, RN, BSN.         Prior Anticoagulation Instructions: INR 2.7  Continue taking regular schedule of 2 tablets every day except 1 tablet on Sundays. Re-check INR in 4 weeks. Rx given for next check, will be out of town.   Current Anticoagulation Instructions: INR 3.0 Today take 2mg s then resume 4mg s daily except 2mg s on Sundays. Recheck in 4 weeks. Rx left for  daughter to pick to have pt INR drawn out of town.

## 2010-10-10 NOTE — Miscellaneous (Signed)
Summary: [facility] Orders  [facility] Orders   Imported By: Roderic Ovens 12/08/2009 15:26:25  _____________________________________________________________________  External Attachment:    Type:   Image     Comment:   External Document

## 2010-10-10 NOTE — Letter (Signed)
Summary: [facility]   [facility]   Imported By: Roderic Ovens 12/12/2009 15:33:31  _____________________________________________________________________  External Attachment:    Type:   Image     Comment:   External Document

## 2010-10-10 NOTE — Miscellaneous (Signed)
Summary: [facility] Orders   [facility]   Imported By: Roderic Ovens 11/18/2009 15:13:46  _____________________________________________________________________  External Attachment:    Type:   Image     Comment:   External Document

## 2010-10-10 NOTE — Miscellaneous (Signed)
Summary: [facility] Orders  [facility]   Imported By: Roderic Ovens 12/16/2009 16:22:47  _____________________________________________________________________  External Attachment:    Type:   Image     Comment:   External Document

## 2010-10-10 NOTE — Medication Information (Signed)
Summary: [facility]  Anticoagulant Therapy  Managed by: Bethena Midget, RN, BSN Referring MD: Jens Som MD, Arlys John PCP: Dr. Doran Durand Supervising MD: Graciela Husbands MD, Viviann Spare Indication 1: Atrial Fibrillation Lab Used: [facility] [place]  [place] Site: [address] PT 28.3 INR POC 2.4 INR RANGE 2.0-3.0  Dietary changes: no    Health status changes: no    Bleeding/hemorrhagic complications: no    Recent/future hospitalizations: no    Any changes in medication regimen? no    Recent/future dental: no  Any missed doses?: no       Is patient compliant with meds? yes      Comments: Discharged on 09/19/09 from [facility]. Hx Heart failure. Bradycardia, Permanent Pacer inserted on 09/06/09, Renal failure,   Allergies: No Known Drug Allergies  Anticoagulation Management History:      Her anticoagulation is being managed by telephone today.  Positive risk factors for bleeding include an age of 75 years or older, history of CVA/TIA, and presence of serious comorbidities.  The bleeding index is 'high risk'.  Positive CHADS2 values include History of CHF, History of HTN, Age > 75 years old, History of Diabetes, and Prior Stroke/CVA/TIA.  Prothrombin time is 28.3.  Anticoagulation responsible provider: Graciela Husbands MD, Viviann Spare.  INR POC: 2.4.    Anticoagulation Management Assessment/Plan:      The next INR is due 09/26/2009.  Anticoagulation instructions were given to home health nurse.  Results were reviewed/authorized by Bethena Midget, RN, BSN.  She was notified by Bethena Midget, RN, BSN.         Current Anticoagulation Instructions: INR 2.4 Continue 2mg s daily. Recheck on 09/26/09. Orders given to Kim with [facility] [place].

## 2010-10-10 NOTE — Medication Information (Signed)
Summary: rov/ewj  Anticoagulant Therapy  Managed by: Weston Brass, PharmD Referring MD: Jens Som MD, Arlys John PCP: Dr. Doran Durand Supervising MD: Johney Frame MD, Fayrene Fearing Indication 1: Atrial Fibrillation Lab Used: [facility]  Site: [address] INR POC 2.8 INR RANGE 2.0-3.0  Dietary changes: no    Health status changes: no    Bleeding/hemorrhagic complications: no    Recent/future hospitalizations: no    Any changes in medication regimen? no    Recent/future dental: no  Any missed doses?: no       Is patient compliant with meds? yes      Comments: Suggested pt come in 2 weeks.  Pt's daughter refused to come back until 4 weeks.  Explained risks and benefits and gave number to call with problems.   Allergies: No Known Drug Allergies  Anticoagulation Management History:      The patient is taking warfarin and comes in today for a routine follow up visit.  Positive risk factors for bleeding include an age of 23 years or older, history of CVA/TIA, and presence of serious comorbidities.  The bleeding index is 'high risk'.  Positive CHADS2 values include History of CHF, History of HTN, Age > 65 years old, History of Diabetes, and Prior Stroke/CVA/TIA.  Her last INR was 1.8 ratio.  Anticoagulation responsible provider: Benjimen Kelley MD, Fayrene Fearing.  INR POC: 2.8.  Cuvette Lot#: [id number].  Exp: 01/2011.    Anticoagulation Management Assessment/Plan:      The patient's current anticoagulation dose is Warfarin sodium 2 mg tabs: as directed.  The target INR is 2.0-3.0.  The next INR is due 01/18/2010.  Anticoagulation instructions were given to daughter/diane-ahc.  Results were reviewed/authorized by Weston Brass, PharmD.  She was notified by Weston Brass PharmD.         Prior Anticoagulation Instructions: INR 2.2  Spoke with Elnita Maxwell, [facility], RN while at pt's home.  Advised to have pt continue on same dosage 4mg  daily.  Pt is being d/c from [facility] today, made OV with pt's daughter for 1 week  on 12/21/09.  Current Anticoagulation Instructions: INR 2.8  Change dose to 2 tablets every day except 1 tablet on Sunday

## 2010-10-10 NOTE — Medication Information (Signed)
Summary: Coumadin Clinic  Anticoagulant Therapy  Managed by: Weston Brass, PharmD Referring MD: Jens Som MD, Arlys John PCP: Dr. Doran Durand Supervising MD: Clifton James MD, Cristal Deer Indication 1: Atrial Fibrillation Lab Used: [facility] [place] Site: [address] PT 28.2 INR POC 2.7 INR RANGE 2.0-3.0           Allergies: No Known Drug Allergies  Anticoagulation Management History:      Positive risk factors for bleeding include an age of 20 years or older, history of CVA/TIA, and presence of serious comorbidities.  The bleeding index is 'high risk'.  Positive CHADS2 values include History of CHF, History of HTN, Age > 75 years old, History of Diabetes, and Prior Stroke/CVA/TIA.  Her last INR was 1.8 ratio.  Prothrombin time is 28.2.  Anticoagulation responsible provider: Clifton James MD, Cristal Deer.  INR POC: 2.7.  Exp: 06/2011.    Anticoagulation Management Assessment/Plan:      The patient's current anticoagulation dose is Warfarin sodium 2 mg tabs: as directed.  The target INR is 2.0-3.0.  The next INR is due 08/22/2010.  Anticoagulation instructions were given to daughter.  Results were reviewed/authorized by Weston Brass, PharmD.         Prior Anticoagulation Instructions: INR 2.5  Called spoke with pt's daughter advised to have pt continue on same dosage 2 tablets daily except 1 tablet on Sundays.  Recheck INR on 08/01/10. Rx left at front desk for pt's daughter to pick-up as pt will be out of town for Thanksgiving.   Current Anticoagulation Instructions: INR 2.7  Spoke with pt's daughter.  Pt is currently admitted to hospital in [facility].  Asked daughter to have someone call us when pt is discharged so we can f/u INR.

## 2010-10-10 NOTE — Medication Information (Signed)
Summary: Coumadin Clinic  Anticoagulant Therapy  Managed by: Cloyde Reams, RN, BSN Referring MD: Jens Som MD, Arlys John PCP: Dr. Doran Durand Supervising MD: Antoine Poche MD, Fayrene Fearing Indication 1: Atrial Fibrillation Lab Used: [facility] [place]  [place] Site: [address] PT 17.5 INR POC 2.1 INR RANGE 2.0-3.0    Bleeding/hemorrhagic complications: no     Any changes in medication regimen? no     Any missed doses?: no         Allergies: No Known Drug Allergies  Anticoagulation Management History:      Her anticoagulation is being managed by telephone today.  Positive risk factors for bleeding include an age of 31 years or older, history of CVA/TIA, and presence of serious comorbidities.  The bleeding index is 'high risk'.  Positive CHADS2 values include History of CHF, History of HTN, Age > 39 years old, History of Diabetes, and Prior Stroke/CVA/TIA.  Prothrombin time is 17.5.  Anticoagulation responsible provider: Antoine Poche MD, Fayrene Fearing.  INR POC: 2.1.    Anticoagulation Management Assessment/Plan:      The patient's current anticoagulation dose is Warfarin sodium 2 mg tabs: as directed.  The target INR is 2.0-3.0.  The next INR is due 10/10/2009.  Anticoagulation instructions were given to home health nurse.  Results were reviewed/authorized by Cloyde Reams, RN, BSN.  She was notified by Cloyde Reams RN.         Prior Anticoagulation Instructions: INR 2.0  Orders to [facility], RN at [facility] at 1030 am 09/26/2009 who is in the home..mep  Increase to 2 tabs each Monday and 1 tab on all others.  Current Anticoagulation Instructions: INR 2.1  Spoke with [facility] RN while at pt's home.  Advised to continue on same dosage 2mg  daily except 4mg  on Mondays.  Recheck in 1 week.

## 2010-10-10 NOTE — Medication Information (Signed)
Summary: Coumadin Clinic  Anticoagulant Therapy  Managed by: Shelby Dubin, PharmD, BCPS, CPP Referring MD: Jens Som MD, Arlys John PCP: Dr. Doran Durand Supervising MD: Jens Som MD, Arlys John Indication 1: Atrial Fibrillation Lab Used: [facility] [place]  [place] Site: [address] PT 17.3 INR POC 2.0 INR RANGE 2.0-3.0  Dietary changes: no    Health status changes: no    Bleeding/hemorrhagic complications: no    Recent/future hospitalizations: no    Any changes in medication regimen? no    Recent/future dental: no  Any missed doses?: no       Is patient compliant with meds? yes      Comments: Called by Tora Duck, RN at [facility]  Allergies (verified): No Known Drug Allergies  Anticoagulation Management History:      Her anticoagulation is being managed by telephone today.  Positive risk factors for bleeding include an age of 18 years or older, history of CVA/TIA, and presence of serious comorbidities.  The bleeding index is 'high risk'.  Positive CHADS2 values include History of CHF, History of HTN, Age > 25 years old, History of Diabetes, and Prior Stroke/CVA/TIA.  Prothrombin time is 17.3.  Anticoagulation responsible provider: Jens Som MD, Arlys John.  INR POC: 2.0.    Anticoagulation Management Assessment/Plan:      The next INR is due 10/03/2009.  Anticoagulation instructions were given to home health nurse.  Results were reviewed/authorized by Shelby Dubin, PharmD, BCPS, CPP.  She was notified by Shelby Dubin PharmD, BCPS, CPP.         Prior Anticoagulation Instructions: INR 2.4 Continue 2mg s daily. Recheck on 09/26/09. Orders given to Kim with [facility].   Current Anticoagulation Instructions: INR 2.0  Orders to [facility], RN at [facility] at 1030 am 09/26/2009 who is in the home..mep  Increase to 2 tabs each Monday and 1 tab on all others.

## 2010-10-10 NOTE — Assessment & Plan Note (Signed)
Summary: 6 month rov 428.22   pfh,rn   Visit Type:  Follow-up Primary Sherylann Vangorden:  Dr. Doran Durand  CC:  CHF.  History of Present Illness: The patient presents for followup of mixed systolic and diastolic heart failure. She was staying in [place] the past several months but apparently is back here now after having a couple of recent hospitalizations. She was apparently hospitalized in November once or volume overloaded once with a non-Q-wave MI. Since coming back she has seen her primary doctor and Dr. Eliott Nine.  Both apparently had trauma recently. Dr.Dunham discontinue lisinopril and increase her Lasix from 2 b.i.d. 3 b.i.d. just a few days ago. The patient has been urinating more. She does have lower extremities swelling but she is keeping her feet down on the ground most of the time. She gets around with a walker at home. She is not describing new PND or orthopnea though she has chronically been unable to sleep with her head down. She's not having a new chest pressure, neck or arm discomfort. She is not describing new palpitations, presyncope or syncope.  Current Medications (verified): 1)  Aspirin 81 Mg  Tabs (Aspirin) .... Daily 2)  Metoprolol Succinate 100 Mg Xr24h-Tab (Metoprolol Succinate) .... One Daily 3)  Zocor 20 Mg Tabs (Simvastatin) .Marland Kitchen.. 1 By Mouth Daily 4)  Niacin Cr 500 Mg Cr-Tabs (Niacin) .... Qhs 5)  Lantus 100 Unit/ml Soln (Insulin Glargine) .... As Directed 6)  Acetaminophen-Codeine #2 300-15 Mg Tabs (Acetaminophen-Codeine) .... As Needed 7)  Citalopram Hydrobromide 20 Mg Tabs (Citalopram Hydrobromide) .... One Pill For Mood 8)  Aricept 5 Mg Tabs (Donepezil Hcl) .... Two Tabs At Night Time 9)  Nova Max Glucose Test  Strp (Glucose Blood) .... Check Sugar Two Times A Day 10)  Acetaminophen 325 Mg  Tabs (Acetaminophen) .... As Needed 11)  Furosemide 40 Mg Tabs (Furosemide) .... 3 By Mouth Two Times A Day 12)  Vitamin D3 1000 Unit Tabs (Cholecalciferol) .Marland Kitchen.. 1 By Mouth  Daily 13)  Klor-Con M10 10 Meq Cr-Tabs (Potassium Chloride Crys Cr) .... Take Two By Mouth Twice A Day 14)  Warfarin Sodium 2 Mg Tabs (Warfarin Sodium) .... As Directed 15)  Hydrocodone-Acetaminophen 5-500 Mg Tabs (Hydrocodone-Acetaminophen) .... As Needed  Allergies (verified): No Known Drug Allergies  Past History:  Past Medical History:  1. Coronary disease.   2. Pulmonary hypertension.   3. Congestive heart failure.   4. Ejection fraction of 45% mild  diastolic dysfunction.   5. Diabetes.   6. Dyslipidemia.   7. CVA.   8. Tachybradycardia syndrome status post permanent pacemaker placement  9. Chronic renal insufficiency     Past Surgical History: Reviewed history from 05/12/2009 and no changes required.  1. CABG.   2. Total hip arthroplasty.   3. Aneurysm repair.   Review of Systems       As stated in the HPI and negative for all other systems.   Vital Signs:  Patient profile:   77 year old female Height:      67 inches Weight:      139 pounds BMI:     21.85 Pulse rate:   69 / minute Resp:     16 per minute BP sitting:   145 / 55  (right arm)  Vitals Entered By: Marrion Coy, CNA (August 11, 2010 12:08 PM)  Physical Exam  General:  Well developed, well nourished, in no acute distress. Head:  normocephalic and atraumatic Neck:  Neck supple, no JVD  at 90. No masses, thyromegaly or abnormal cervical nodes. Lungs:  Clear bilaterally to auscultation and percussion. Heart:  S1 and S2 within normal limits, no S3, no S4, no clicks, no rubs Abdomen:  Bowel sounds positive; abdomen soft and non-tender , unable to appreciate organomegaly or midline pulsatile mass with the patient seated Msk:  diffusely diminished muscle mass and strength Pulses:  2 both upper pulses, diminished dorsalis pedis and posterior tibialis Extremities:  moderate bilateral lower extremity edema to the knees Neurologic:  Alert and oriented x 3. Cervical Nodes:  no significant  adenopathy Psych:  Normal affect.   EKG  Procedure date:  08/11/2010  Findings:      Atrial fibrillation, left axis deviation, old anteroseptal infarct, nonspecific lateral T-wave changes  PPM Specifications Following MD:  Rollene Rotunda, MD     PPM Vendor:  [employer]     PPM Model Number:  [id number]     PPM Serial Number:  [id number] PPM DOI:  09/06/2009     PPM Implanting MD:  Hillis Range, MD  Lead 1    Location: RA     DOI: 09/06/2009     Model #: [id number]     Serial #: [id number]     Status: active Lead 2    Location: RV     DOI: 09/06/2009     Model #: [id number]     Serial #: [id number]     Status: active  Magnet Response Rate:  BOL 85 ERI  65  Indications:  Sick sinus syndrome   PPM Follow Up Pacer Dependent:  No      Episodes Coumadin:  Yes  Parameters Mode:  MVP (R)     Lower Rate Limit:  60     Upper Rate Limit:  130 Paced AV Delay:  150     Sensed AV Delay:  120  Impression & Recommendations:  Problem # 1:  DIASTOLIC HEART FAILURE, ACUTE ON CHRONIC (ICD-428.33) Patient has chronic systolic and diastolic heart failure.  She recently had labs drawn and her meds adjusted by her nephrologist. I will treat conservatively and have again emphasized the need to keep her feet elevated and I will give her a compression stocking prescription as her daughters can put these on. She has followup next week in the nephrology clinic for blood work.  Problem # 2:  CORONARY ATHEROSCLEROSIS OF ARTERY BYPASS GRAFT (ICD-414.04) She is having no ongoing chest pain and will continue medical management.  Problem # 3:  ATRIAL FIBRILLATION (ICD-427.31) She tolerates Coumadin. No change in therapy is indicated. Orders: EKG w/ Interpretation (93000)  Patient Instructions: 1)  Your physician recommends that you schedule a follow-up appointment in: 2 months with Dr Antoine Poche 2)  Your physician recommends that you continue on your current medications as directed. Please refer to the Current Medication  list given to you today.

## 2010-10-10 NOTE — Medication Information (Signed)
Summary: rov/ewj  Anticoagulant Therapy  Managed by: Bethena Midget, RN, BSN Referring MD: Jens Som MD, Arlys John PCP: Dr. Doran Durand Supervising MD: Excell Seltzer MD, Casimiro Needle Indication 1: Atrial Fibrillation Lab Used: [facility] [place] Site: [address] INR POC 2.3 INR RANGE 2.0-3.0  Dietary changes: no    Health status changes: no    Bleeding/hemorrhagic complications: no    Recent/future hospitalizations: no    Any changes in medication regimen? no    Recent/future dental: no  Any missed doses?: no       Is patient compliant with meds? yes       Allergies: No Known Drug Allergies  Anticoagulation Management History:      The patient is taking warfarin and comes in today for a routine follow up visit.  Positive risk factors for bleeding include an age of 75 years or older, history of CVA/TIA, and presence of serious comorbidities.  The bleeding index is 'high risk'.  Positive CHADS2 values include History of CHF, History of HTN, Age > 21 years old, History of Diabetes, and Prior Stroke/CVA/TIA.  Her last INR was 1.8 ratio.  Anticoagulation responsible provider: Excell Seltzer MD, Casimiro Needle.  INR POC: 2.3.  Cuvette Lot#: [id number].  Exp: 06/2011.    Anticoagulation Management Assessment/Plan:      The patient's current anticoagulation dose is Warfarin sodium 2 mg tabs: as directed.  The target INR is 2.0-3.0.  The next INR is due 05/12/2010.  Anticoagulation instructions were given to patient/daughter.  Results were reviewed/authorized by Bethena Midget, RN, BSN.  She was notified by Bethena Midget, RN, BSN.         Prior Anticoagulation Instructions: INR 2.0  Continue on same dosage 2 tablets daily except 1 tablet Sundays.  Recheck in 4 weeks.    Current Anticoagulation Instructions: INR 2.3 Continue 2 pills everyday except 1 pill on Sundays. Recheck in 4 weeks.

## 2010-10-10 NOTE — Medication Information (Signed)
Summary: Coumadin Clinic  Anticoagulant Therapy  Managed by: Cloyde Reams, RN, BSN Referring MD: Jens Som MD, Arlys John PCP: Dr. Doran Durand Supervising MD: Shirlee Latch MD, Dalton Indication 1: Atrial Fibrillation Lab Used: [facility] [place]  [place] Site: [address] PT 36.3 INR POC 5.3 INR RANGE 2.0-3.0          Comments: INR 6.0  PT-29.3  request nurse do veinapuncture, will await results.  Cloyde Reams RN  October 10, 2009 10:28 AM   Allergies: No Known Drug Allergies  Anticoagulation Management History:      Positive risk factors for bleeding include an age of 75 years or older, history of CVA/TIA, and presence of serious comorbidities.  The bleeding index is 'high risk'.  Positive CHADS2 values include History of CHF, History of HTN, Age > 75 years old, History of Diabetes, and Prior Stroke/CVA/TIA.  Today's INR is 3.69.  Prothrombin time is 36.3.  Anticoagulation responsible provider: Shirlee Latch MD, Dalton.  INR POC: 5.3.    Anticoagulation Management Assessment/Plan:      The patient's current anticoagulation dose is Warfarin sodium 2 mg tabs: as directed.  The target INR is 2.0-3.0.  The next INR is due 10/18/2009.  Anticoagulation instructions were given to pt's daughter.  Results were reviewed/authorized by Cloyde Reams, RN, BSN.  She was notified by Cloyde Reams RN.         Prior Anticoagulation Instructions: INR 2.1  Spoke with [facility] RN while at pt's home.  Advised to continue on same dosage 2mg  daily except 4mg  on Mondays.  Recheck in 1 week.    Current Anticoagulation Instructions: INR 3.69 blood draw Attempted to contact pt with results.  LMOM TCB. Cloyde Reams RN  October 10, 2009 4:26 PM  Called spoke with pt's daughter advised to have pt skip todays dosage of coumadin then start taking 1 tablet (2mg ) daily.  Recheck on 10/18/09.  Called [facility] spoke with Melissa gave verbal orders.

## 2010-10-10 NOTE — Miscellaneous (Signed)
  Clinical Lists Changes  Observations: Added new observation of ECHOINTERP:  Study Conclusions    - Left ventricle: Abnormal septal motion The cavity size was     moderately dilated. Wall thickness was increased in a pattern of     severe LVH. The estimated ejection fraction was 55%.   - Aortic valve: Mild regurgitation.   - Mitral valve: Calcified annulus.   - Left atrium: The atrium was moderately to severely dilated.   - Right atrium: The atrium was severely dilated.   - Tricuspid valve: Mild-moderate regurgitation.   - Pulmonary arteries: PA peak pressure: 65mm Hg (S).     --------------------------------------------------------------------   Prepared and Electronically Authenticated by    Charlton Haws, MD, [facility]   2011-01-03T10:24:[id number] (09/10/2009 8:09)      Echocardiogram  Procedure date:  09/10/2009  Findings:       Study Conclusions    - Left ventricle: Abnormal septal motion The cavity size was     moderately dilated. Wall thickness was increased in a pattern of     severe LVH. The estimated ejection fraction was 55%.   - Aortic valve: Mild regurgitation.   - Mitral valve: Calcified annulus.   - Left atrium: The atrium was moderately to severely dilated.   - Right atrium: The atrium was severely dilated.   - Tricuspid valve: Mild-moderate regurgitation.   - Pulmonary arteries: PA peak pressure: 65mm Hg (S).     --------------------------------------------------------------------   Prepared and Electronically Authenticated by    Charlton Haws, MD, [facility]   2011-01-03T10:24:[id number]

## 2010-10-10 NOTE — Medication Information (Signed)
Summary: Coumadin Clinic  Anticoagulant Therapy  Managed by: Bethena Midget, RN, BSN Referring MD: Jens Som MD, Arlys John PCP: Dr. Doran Durand Supervising MD: Eden Emms MD, Theron Arista Indication 1: Atrial Fibrillation Lab Used: [facility] [place] Site: [address] PT 19.1 INR POC 1.8 INR RANGE 2.0-3.0  Dietary changes: no    Health status changes: no    Bleeding/hemorrhagic complications: no    Recent/future hospitalizations: no    Any changes in medication regimen? no    Recent/future dental: no  Any missed doses?: no       Is patient compliant with meds? yes      Comments: Saw Dr Antoine Poche today.   Allergies: No Known Drug Allergies  Anticoagulation Management History:      Her anticoagulation is being managed by telephone today.  Positive risk factors for bleeding include an age of 23 years or older, history of CVA/TIA, and presence of serious comorbidities.  The bleeding index is 'high risk'.  Positive CHADS2 values include History of CHF, History of HTN, Age > 49 years old, History of Diabetes, and Prior Stroke/CVA/TIA.  Her last INR was 3.69.  Prothrombin time is 19.1.  Anticoagulation responsible provider: Eden Emms MD, Theron Arista.  INR POC: 1.8.    Anticoagulation Management Assessment/Plan:      The patient's current anticoagulation dose is Warfarin sodium 2 mg tabs: as directed.  The target INR is 2.0-3.0.  The next INR is due 12/14/2009.  Anticoagulation instructions were given to daughter/diane-ahc.  Results were reviewed/authorized by Bethena Midget, RN, BSN.  She was notified by Bethena Midget, RN, BSN.         Prior Anticoagulation Instructions: INR 1.8  Attempted to call pt with results.  LMOM TCB for results. Cloyde Reams RN  November 30, 2009 2:20 PM  Spoke with Lewayne Bunting, pt's daughter advised to have pt take 2.5 tablets today then incr dosage to 2 tablets daily except 1 tablet on Mondays.  Recheck in 1 week.  Hattie Perch, [facility], RN verbal orders given to  recheck PT/INR on 12/07/09.  Current Anticoagulation Instructions: INR 1.8 Today 6mg s then change dose to 4mg s daily. Recheck in one week Orders given to [facility] team Diane by Genelle Bal, RN. Bethena Midget, RN, BSN  December 07, 2009 5:14 PM

## 2010-10-10 NOTE — Medication Information (Signed)
Summary: rov/sp  Anticoagulant Therapy  Managed by: Bethena Midget, RN, BSN Referring MD: Jens Som MD, Arlys John PCP: Dr. Doran Durand Supervising MD: Clifton James MD, Cristal Deer Indication 1: Atrial Fibrillation Lab Used: [facility] [place] Site: [address] INR POC 2.2 INR RANGE 2.0-3.0  Dietary changes: no    Health status changes: no    Bleeding/hemorrhagic complications: no    Recent/future hospitalizations: no    Any changes in medication regimen? no    Recent/future dental: no  Any missed doses?: no       Is patient compliant with meds? yes       Allergies: No Known Drug Allergies  Anticoagulation Management History:      The patient is taking warfarin and comes in today for a routine follow up visit.  Positive risk factors for bleeding include an age of 75 years or older, history of CVA/TIA, and presence of serious comorbidities.  The bleeding index is 'high risk'.  Positive CHADS2 values include History of CHF, History of HTN, Age > 59 years old, History of Diabetes, and Prior Stroke/CVA/TIA.  Her last INR was 1.8 ratio.  Anticoagulation responsible provider: Clifton James MD, Cristal Deer.  INR POC: 2.2.  Cuvette Lot#: [id number].  Exp: 04/2011.    Anticoagulation Management Assessment/Plan:      The patient's current anticoagulation dose is Warfarin sodium 2 mg tabs: as directed.  The target INR is 2.0-3.0.  The next INR is due 03/17/2010.  Anticoagulation instructions were given to patient/daughter.  Results were reviewed/authorized by Bethena Midget, RN, BSN.  She was notified by Bethena Midget, RN, BSN.         Prior Anticoagulation Instructions: INR 2.8  Continue same dose of 2 tablets every day except 1 tablet on Sunday   Current Anticoagulation Instructions: INR 2.2 Continue 2 pills everyday except 1 pill on Sundays. Recheck in 4 weeks.

## 2010-10-10 NOTE — Medication Information (Signed)
Summary: Coumadin Clinic  Anticoagulant Therapy  Managed by: Cloyde Reams, RN, BSN Referring MD: Jens Som MD, Arlys John PCP: Dr. Doran Durand Supervising MD: Riley Kill MD, Maisie Fus Indication 1: Atrial Fibrillation Lab Used: [facility] [place] Site: [address] PT 18.3 INR POC 2.2 INR RANGE 2.0-3.0    Bleeding/hemorrhagic complications: no     Any changes in medication regimen? no     Any missed doses?: no       Is patient compliant with meds? yes       Allergies: No Known Drug Allergies  Anticoagulation Management History:      The patient is taking warfarin and comes in today for a routine follow up visit.  Positive risk factors for bleeding include an age of 75 years or older, history of CVA/TIA, and presence of serious comorbidities.  The bleeding index is 'high risk'.  Positive CHADS2 values include History of CHF, History of HTN, Age > 64 years old, History of Diabetes, and Prior Stroke/CVA/TIA.  Her last INR was 1.8 ratio.  Prothrombin time is 18.3.  Anticoagulation responsible provider: Riley Kill MD, Maisie Fus.  INR POC: 2.2.    Anticoagulation Management Assessment/Plan:      The patient's current anticoagulation dose is Warfarin sodium 2 mg tabs: as directed.  The target INR is 2.0-3.0.  The next INR is due 12/21/2009.  Anticoagulation instructions were given to daughter/diane-ahc.  Results were reviewed/authorized by Cloyde Reams, RN, BSN.  She was notified by Cloyde Reams RN.         Prior Anticoagulation Instructions: INR 1.8 Today 6mg s then change dose to 4mg s daily. Recheck in one week Orders given to [facility] team Diane by Genelle Bal, RN. Bethena Midget, RN, BSN  December 07, 2009 5:14 PM   Current Anticoagulation Instructions: INR 2.2  Spoke with Elnita Maxwell, [facility], RN while at pt's home.  Advised to have pt continue on same dosage 4mg  daily.  Pt is being d/c from [facility] today, made OV with pt's daughter for 1 week on 12/21/09.

## 2010-10-10 NOTE — Medication Information (Signed)
Summary: Coumadin Clinic  Anticoagulant Therapy  Managed by: Cloyde Reams, RN, BSN Referring MD: Jens Som MD, Arlys John PCP: Dr. Doran Durand Supervising MD: Myrtis Ser MD, Tinnie Gens Indication 1: Atrial Fibrillation Lab Used: [facility] [place]  [place] Site: [address] PT 15.1 INR POC 1.5 INR RANGE 2.0-3.0    Bleeding/hemorrhagic complications: no     Any changes in medication regimen? no     Any missed doses?: no       Is patient compliant with meds? yes      Comments: Verified with HH, RN pt has not missed any doses of coumadin since last visit.    Allergies: No Known Drug Allergies  Anticoagulation Management History:      Her anticoagulation is being managed by telephone today.  Positive risk factors for bleeding include an age of 71 years or older, history of CVA/TIA, and presence of serious comorbidities.  The bleeding index is 'high risk'.  Positive CHADS2 values include History of CHF, History of HTN, Age > 91 years old, History of Diabetes, and Prior Stroke/CVA/TIA.  Her last INR was 3.69.  Prothrombin time is 15.1.  Anticoagulation responsible provider: Myrtis Ser MD, Tinnie Gens.  INR POC: 1.5.    Anticoagulation Management Assessment/Plan:      The patient's current anticoagulation dose is Warfarin sodium 2 mg tabs: as directed.  The target INR is 2.0-3.0.  The next INR is due 11/22/2009.  Anticoagulation instructions were given to [facility], RN.  Results were reviewed/authorized by Cloyde Reams, RN, BSN.  She was notified by Cloyde Reams RN.         Prior Anticoagulation Instructions: LMOM .Marland KitchenBethena Midget, RN, BSN  November 08, 2009 2:51 PM  Spoke with daughter and dosage given. Also spoke with Amaryllis Dyke at [facility] and gave orders with dose and f/u 11/15/09   Current Anticoagulation Instructions: INR 1.5  Spoke with Claris Che, [facility] RN while at pt's home.  Take 2 tablets tomorrow then start taking 1 tablet daily except 2 tablets on Tuesdays, Thursdays, and Saturdays.   Recheck in 1 week.

## 2010-10-10 NOTE — Cardiovascular Report (Signed)
Summary: Office Visit   Office Visit   Imported By: Roderic Ovens 11/10/2009 14:08:16  _____________________________________________________________________  External Attachment:    Type:   Image     Comment:   External Document

## 2010-10-10 NOTE — Assessment & Plan Note (Signed)
Summary: rov/edema/feet and arms   Visit Type:  Follow-up Referring Provider:  [facility] Primary Provider:  Dr. Doran Durand  CC:  legs weeping -no chest pain -sob is pretty good per pt.  History of Present Illness: The patient is 77 years old and came in today for an unscheduled visit because of increasing edema. She was hospitalized in January with paroxysmal A. to fibrillation and then long sinus pauses and required implantation of a DDD pacemaker. She has a long history of diastolic heart failure with marked LVH by echocardiography and ejection fraction of 55% left atrial and right atrial enlargement. She's had previous bypass surgery and has renal insufficiency with creatinine in the range of 2.8. Her atrial fibrillation has been treated with Coumadin and rate control.  She saw Dr. Allena Katz yesterday with increased edema and Dr. Allena Katz increase the Lasix from 40 mg a day to 40 mg b.i.d. This has not yet made to much difference.  Current Medications (verified): 1)  Aspirin 81 Mg  Tabs (Aspirin) .... Daily 2)  Metoprolol Succinate 100 Mg Xr24h-Tab (Metoprolol Succinate) .... One Daily 3)  Zocor 20 Mg Tabs (Simvastatin) .Marland Kitchen.. 1 By Mouth Daily 4)  Niacin Cr 500 Mg Cr-Tabs (Niacin) .... Qhs 5)  Lantus .... As Directed 6)  Acetaminophen-Codeine #2 300-15 Mg Tabs (Acetaminophen-Codeine) .... As Needed 7)  Citalopram Hydrobromide 20 Mg Tabs (Citalopram Hydrobromide) .... One Pill For Mood 8)  Aricept 5 Mg Tabs (Donepezil Hcl) .... Two Tabs At Night Time 9)  Nova Max Glucose Test  Strp (Glucose Blood) .... Check Sugar Two Times A Day 10)  Acetaminophen 325 Mg  Tabs (Acetaminophen) .... As Needed 11)  Furosemide 40 Mg Tabs (Furosemide) .Marland Kitchen.. 1 Tab Two Times A Day 12)  Vitamin D3 1000 Unit Tabs (Cholecalciferol) .Marland Kitchen.. 1 By Mouth Daily 13)  Klor-Con M10 10 Meq Cr-Tabs (Potassium Chloride Crys Cr) .Marland Kitchen.. 1 By Mouth Daily 14)  Warfarin Sodium 2 Mg Tabs (Warfarin Sodium) .... As Directed 15)  Claritin 10  Mg Tabs (Loratadine) .Marland Kitchen.. 1 By Mouth Daily 16)  Lisinopril 10 Mg Tabs (Lisinopril) .Marland Kitchen.. 1 By Mouth Daily 17)  Niacin 500 Mg Tabs (Niacin) .Marland Kitchen.. 1 Tab Once Daily  Allergies (verified): No Known Drug Allergies  Past History:  Past Medical History: Reviewed history from 09/30/2009 and no changes required.  1. Coronary disease.   2. Pulmonary hypertension.   3. Congestive heart failure.   4. Ejection fraction of 45% per catheterization  in May and mild       diastolic dysfunction.   5. Diabetes.   6. Dyslipidemia.   7. CVA.   8. Tachybradycardia syndrome status post permanent pacemaker placement     Review of Systems       ROS is negative except as outlined in HPI.   Vital Signs:  Patient profile:   77 year old female Height:      67 inches Weight:      174 pounds Pulse rate:   63 / minute BP sitting:   155 / 82  (left arm) Cuff size:   regular  Vitals Entered By: Burnett Kanaris, CNA (November 03, 2009 2:35 PM)  Physical Exam  Additional Exam:  Gen. Well-nourished, in no distress Head: Normocephalic    Eyes: PERRLA/EOM intact; conjunctiva and lids normal Neck: JVD 3 cm above the clavicle, thyroid not enlarged, no carotid bruits Lungs: No tachypnea, clear w/o rales, rhonchi or wheezes CV: Rhythm regular, PMI not displaced,  sounds  nl, no murmurs or  gallops, 2-3+ bilateral lower extremity edema with weeping lesions, pulses nl UE and LE Abd: BS nl, abd soft and non-tender w/o masses or hepatosplenomegaly MS: No deformities Neuro: no focal sns Skin: no lesions Psych: nl affect    PPM Specifications Following MD:  Hillis Range, MD     PPM Vendor:  [employer]     PPM Model Number:  [id number]     PPM Serial Number:  [id number] PPM DOI:  09/06/2009     PPM Implanting MD:  Hillis Range, MD  Lead 1    Location: RA     DOI: 09/06/2009     Model #: [id number]     Serial #: [id number]     Status: active Lead 2    Location: RV     DOI: 09/06/2009     Model #: [id number]     Serial #: [id number]      Status: active  Magnet Response Rate:  BOL 85 ERI  65  Indications:  Sick sinus syndrome   PPM Follow Up Remote Check?  No Battery Voltage:  2.80 V     Battery Est. Longevity:  8.5 years     Pacer Dependent:  No       PPM Device Measurements Atrium  Amplitude: 2.8 mV, Impedance: 526 ohms, Threshold: 0.5 V at 0.4 msec Right Ventricle  Amplitude: 5.6 mV, Impedance: 529 ohms, Threshold: 0.5 V at 0.4 msec  Episodes MS Episodes:  0     Percent Mode Switch:  0     Coumadin:  Yes Ventricular High Rate:  0     Atrial Pacing:  66.6%     Ventricular Pacing:  0.3%  Parameters Mode:  DDDR+     Lower Rate Limit:  60     Upper Rate Limit:  130 Paced AV Delay:  150     Sensed AV Delay:  120 Tech Comments:  Interrogation only.  Altha Harm, LPN  November 03, 2009 2:46 PM   Impression & Recommendations:  Problem # 1:  CHF (ICD-428.0)  She has a history of diastolic heart failure now comes in with marked volume overload. She has JVD and marked edema of the lower extremities. She has marked LVH by echo with good systolic function. Her renal insufficiency we'll make her treatment of her congestive heart failure more difficult. We will increase her Lasix to 80 mg in the morning and 40 mg in the afternoon and asked her to weigh herself daily. We'll get a BMP and BNP today. We'll have her see Wende Bushy next week and Dr. Antoine Poche a week later. Dr. Antoine Poche is her primary cardiologist.  Her weight when she got home from the hospital in mid January was 159. Her weight at home today was 173. So she gained 14 pounds. Our goal will be for her to lose about half of that or about 7-8 pounds over the next week. We asked the family to bring in a record of her weights over the next week.  Her updated medication list for this problem includes:    Aspirin 81 Mg Tabs (Aspirin) .Marland Kitchen... Daily    Metoprolol Succinate 100 Mg Xr24h-tab (Metoprolol succinate) ..... One daily    Furosemide 40 Mg Tabs (Furosemide)  .Marland Kitchen... 1 tab two times a day    Warfarin Sodium 2 Mg Tabs (Warfarin sodium) .Marland Kitchen... As directed    Lisinopril 10 Mg Tabs (Lisinopril) .Marland Kitchen... 1 by mouth daily  Her updated medication list for this problem includes:  Aspirin 81 Mg Tabs (Aspirin) .Marland Kitchen... Daily    Metoprolol Succinate 100 Mg Xr24h-tab (Metoprolol succinate) ..... One daily    Furosemide 40 Mg Tabs (Furosemide) .Marland Kitchen... Take two tablets tabs every morning and one tab every evening    Warfarin Sodium 2 Mg Tabs (Warfarin sodium) .Marland Kitchen... As directed    Lisinopril 10 Mg Tabs (Lisinopril) .Marland Kitchen... 1 by mouth daily  Orders: TLB-BMP (Basic Metabolic Panel-BMET) (80048-METABOL) TLB-BNP (B-Natriuretic Peptide) (83880-BNPR) TLB-CBC Platelet - w/Differential (85025-CBCD) TLB-Hepatic/Liver Function Pnl (80076-HEPATIC) T-2 View CXR (71020TC)  Problem # 2:  BRADYCARDIA-TACHYCARDIA SYNDROME (ICD-427.81)  She has a history of paroxysmal atrial fibrillation and bradycardia. She is in sinus rhythm today. Her updated medication list for this problem includes:    Aspirin 81 Mg Tabs (Aspirin) .Marland Kitchen... Daily    Metoprolol Succinate 100 Mg Xr24h-tab (Metoprolol succinate) ..... One daily    Warfarin Sodium 2 Mg Tabs (Warfarin sodium) .Marland Kitchen... As directed    Lisinopril 10 Mg Tabs (Lisinopril) .Marland Kitchen... 1 by mouth daily  Her updated medication list for this problem includes:    Aspirin 81 Mg Tabs (Aspirin) .Marland Kitchen... Daily    Metoprolol Succinate 100 Mg Xr24h-tab (Metoprolol succinate) ..... One daily    Warfarin Sodium 2 Mg Tabs (Warfarin sodium) .Marland Kitchen... As directed    Lisinopril 10 Mg Tabs (Lisinopril) .Marland Kitchen... 1 by mouth daily  Problem # 3:  CAD (ICD-414.00) She has had previous bypass surgery. She has had no recent chest pain and this problem appears stable. Her updated medication list for this problem includes:    Aspirin 81 Mg Tabs (Aspirin) .Marland Kitchen... Daily    Metoprolol Succinate 100 Mg Xr24h-tab (Metoprolol succinate) ..... One daily    Warfarin Sodium 2 Mg Tabs  (Warfarin sodium) .Marland Kitchen... As directed    Lisinopril 10 Mg Tabs (Lisinopril) .Marland Kitchen... 1 by mouth daily  Problem # 4:  PACEMAKER (ICD-V45.Marland Kitchen01) We interrogated her pacemaker today. She is in sinus rhythm. She is pacing the atrium most of the time in the ventricle very little. She has had no mode switches since her discharge.  Problem # 5:  RENAL INSUFFICIENCY (ICD-588.9) She has chronic renal insufficiency which will make treatment of her CHF more difficult. We will get a BMP today.  Patient Instructions: 1)  Your physician recommends that you schedule a follow-up appointment in: 1 week with the PA 2)  Your physician recommends that you have lab work today: cbc/bmet/bnp/liver (428.0) 3)  Your physician has recommended you make the following change in your medication: 1) Increase lasix to 80mg  every morning and 40mg  every evening. 4)  A chest x-ray takes a picture of the organs and structures inside the chest, including the heart, lungs, and blood vessels. This test can show several things, including, whether the heart is enlarged; whether fluid is building up in the lungs; and whether pacemaker / defibrillator leads are still in place. 5)  Weigh yourself daily.

## 2010-10-10 NOTE — Miscellaneous (Signed)
Summary: [facility] Orders   [facility] Orders   Imported By: Roderic Ovens 01/04/2010 14:16:16  _____________________________________________________________________  External Attachment:    Type:   Image     Comment:   External Document

## 2010-10-10 NOTE — Miscellaneous (Signed)
Summary: Echo  Clinical Lists Changes  Observations: Added new observation of ECHOINTERP:   --------------------------------------------------------------------   Study Conclusions    - Left ventricle: Abnormal septal motion The cavity size was     moderately dilated. Wall thickness was increased in a pattern of     severe LVH. The estimated ejection fraction was 55%.   - Aortic valve: Mild regurgitation.   - Mitral valve: Calcified annulus.   - Left atrium: The atrium was moderately to severely dilated.   - Right atrium: The atrium was severely dilated.   - Tricuspid valve: Mild-moderate regurgitation.   - Pulmonary arteries: PA peak pressure: 65mm Hg (S).    --------------------------------------------------------------------  Prepared and Electronically Authenticated by    Charlton Haws, MD, [facility]   2011-01-03T10:24:07.787 (09/10/2009 10:28)      Echocardiogram  Procedure date:  09/10/2009  Findings:        --------------------------------------------------------------------   Study Conclusions    - Left ventricle: Abnormal septal motion The cavity size was     moderately dilated. Wall thickness was increased in a pattern of     severe LVH. The estimated ejection fraction was 55%.   - Aortic valve: Mild regurgitation.   - Mitral valve: Calcified annulus.   - Left atrium: The atrium was moderately to severely dilated.   - Right atrium: The atrium was severely dilated.   - Tricuspid valve: Mild-moderate regurgitation.   - Pulmonary arteries: PA peak pressure: 65mm Hg (S).    --------------------------------------------------------------------  Prepared and Electronically Authenticated by    Charlton Haws, MD, [facility]   2011-01-03T10:24:07.787

## 2010-10-10 NOTE — Cardiovascular Report (Signed)
Summary: Office Visit   Office Visit   Imported By: Roderic Ovens 12/12/2009 14:44:08  _____________________________________________________________________  External Attachment:    Type:   Image     Comment:   External Document

## 2010-10-10 NOTE — Progress Notes (Signed)
Summary: not feeling well  Phone Note Call from Patient Call back at [phone]   Caller: Daughter/Ruby Reason for Call: Talk to Nurse Summary of Call: not feeling well.... weak, request to be seen, needs pacer checked Initial call taken by: Migdalia Dk,  Feb 07, 2010 11:49 AM  Follow-up for Phone Call        calling again, Migdalia Dk  Feb 07, 2010 1:28 PM    Spoke with Mora Bellman who reports pt is feeling very weak and has no energy.  Just started recently.  Daughter feels like pt may be "getting a little fluid" however she has no SOB and only slight edema at feet and ankles. appt given for 02/09/2010 at 3:45 pm Follow-up by: Charolotte Capuchin, RN,  Feb 07, 2010 1:43 PM

## 2010-10-10 NOTE — Cardiovascular Report (Signed)
Summary: Office Visit   Office Visit   Imported By: Roderic Ovens 10/18/2009 15:41:13  _____________________________________________________________________  External Attachment:    Type:   Image     Comment:   External Document

## 2010-10-10 NOTE — Miscellaneous (Signed)
Summary: [facility] Orders   [facility] Orders   Imported By: Roderic Ovens 10/26/2009 16:47:02  _____________________________________________________________________  External Attachment:    Type:   Image     Comment:   External Document

## 2010-10-10 NOTE — Letter (Signed)
Summary: [facility] Office Note  [facility] Office Note   Imported By: Roderic Ovens 12/09/2009 10:11:00  _____________________________________________________________________  External Attachment:    Type:   Image     Comment:   External Document

## 2010-10-10 NOTE — Miscellaneous (Signed)
Summary: [facility]   [facility]   Imported By: Roderic Ovens 11/18/2009 16:25:55  _____________________________________________________________________  External Attachment:    Type:   Image     Comment:   External Document

## 2010-10-10 NOTE — Medication Information (Signed)
Summary: rov/tm  Anticoagulant Therapy  Managed by: Weston Brass, PharmD Referring MD: Jens Som MD, Arlys John PCP: Dr. Doran Durand Supervising MD: Daleen Squibb MD, Maisie Fus Indication 1: Atrial Fibrillation Lab Used: [facility]  Site: [address] INR POC 2.7 INR RANGE 2.0-3.0  Dietary changes: no    Health status changes: no    Bleeding/hemorrhagic complications: no    Recent/future hospitalizations: no    Any changes in medication regimen? no    Recent/future dental: no  Any missed doses?: no       Is patient compliant with meds? yes      Comments: Pt will be going out of town. Rx given for next check in 4 weeks.   Allergies: No Known Drug Allergies  Anticoagulation Management History:      The patient is taking warfarin and comes in today for a routine follow up visit.  Positive risk factors for bleeding include an age of 27 years or older, history of CVA/TIA, and presence of serious comorbidities.  The bleeding index is 'high risk'.  Positive CHADS2 values include History of CHF, History of HTN, Age > 98 years old, History of Diabetes, and Prior Stroke/CVA/TIA.  Her last INR was 1.8 ratio.  Anticoagulation responsible provider: Daleen Squibb MD, Maisie Fus.  INR POC: 2.7.  Cuvette Lot#: [id number].  Exp: 06/2011.    Anticoagulation Management Assessment/Plan:      The patient's current anticoagulation dose is Warfarin sodium 2 mg tabs: as directed.  The target INR is 2.0-3.0.  The next INR is due 06/09/2010.  Anticoagulation instructions were given to patient/daughter.  Results were reviewed/authorized by Weston Brass, PharmD.  She was notified by Harrel Carina, PharmD candidate.         Prior Anticoagulation Instructions: INR 2.3 Continue 2 pills everyday except 1 pill on Sundays. Recheck in 4 weeks.   Current Anticoagulation Instructions: INR 2.7  Continue taking regular schedule of 2 tablets every day except 1 tablet on Sundays. Re-check INR in 4 weeks. Rx given for  next check, will be out of town.

## 2010-10-10 NOTE — Miscellaneous (Signed)
Summary: [facility]  [facility] Orders   Imported By: Roderic Ovens 02/07/2010 11:14:58  _____________________________________________________________________  External Attachment:    Type:   Image     Comment:   External Document

## 2010-10-11 ENCOUNTER — Encounter: Payer: Self-pay | Admitting: Cardiology

## 2010-10-12 NOTE — Medication Information (Signed)
Summary: Coumadin Clinic  Anticoagulant Therapy  Managed by: Bethena Midget, RN, BSN Referring MD: Jens Som MD, Arlys John PCP: Dr. Doran Durand Supervising MD: Riley Kill MD, Maisie Fus Indication 1: Atrial Fibrillation Lab Used: [facility] Site: [address] PT 17.4 INR POC 1.5 INR RANGE 2.0-3.0  Dietary changes: no    Health status changes: no    Bleeding/hemorrhagic complications: no    Recent/future hospitalizations: no    Any changes in medication regimen? no    Recent/future dental: no  Any missed doses?: no       Is patient compliant with meds? yes      Comments: Per [facility] nurse Bonita Quin she states that the tablet strength that pt has at home is 1mg s not 2mg s. Daughter states that there are no other tablet strengths at the house. Also last night, she accidently gave pt extra tablet.   Allergies: No Known Drug Allergies  Anticoagulation Management History:      Her anticoagulation is being managed by telephone today.  Positive risk factors for bleeding include an age of 75 years or older, history of CVA/TIA, and presence of serious comorbidities.  The bleeding index is 'high risk'.  Positive CHADS2 values include History of CHF, History of HTN, Age > 17 years old, History of Diabetes, and Prior Stroke/CVA/TIA.  Her last INR was 1.8 ratio.  Prothrombin time is 17.4.  Anticoagulation responsible provider: Riley Kill MD, Maisie Fus.  INR POC: 1.5.    Anticoagulation Management Assessment/Plan:      The patient's current anticoagulation dose is Warfarin sodium 2 mg tabs: as directed, Warfarin sodium 1 mg tabs: Use as directed by Anticoagualtion Clinic.  The target INR is 2.0-3.0.  The next INR is due 09/08/2010.  Anticoagulation instructions were given to home health nurse.  Results were reviewed/authorized by Bethena Midget, RN, BSN.  She was notified by Bethena Midget, RN, BSN.         Prior Anticoagulation Instructions: INR 1.8  Spoke with pt's caregiver, Luetta Nutting.  Take 2 tablets today and  tomorrow then start new dose of 1 tablet every day except 2 tablets on Monday and Friday.  Recheck INR in 1 week.  Orders given to [facility] with Hospice.   Current Anticoagulation Instructions: INR 1.5 Change dose 1mg s daily except 2mg s on MWF. Recheck INR on 09/08/10 orders given to [facility] nurse while at home.  Prescriptions: WARFARIN SODIUM 1 MG TABS (WARFARIN SODIUM) Use as directed by Anticoagualtion Clinic  #45 x 3   Entered by:   Bethena Midget, RN, BSN   Authorized by:   Ferman Hamming, MD, [facility]   Signed by:   Bethena Midget, RN, BSN on 09/01/2010   Method used:   Electronically to        [facility] [phone]* (retail)       [address]       [place], [place]  [address]       Ph: [phone]       Fax: [phone]   RxID:   [id number]

## 2010-10-12 NOTE — Medication Information (Signed)
Summary: Coumadin Clinic  Anticoagulant Therapy  Managed by: Bethena Midget, RN, BSN Referring MD: Jens Som MD, Arlys John PCP: Dr. Doran Durand Supervising MD: Tenny Craw MD, Gunnar Fusi Indication 1: Atrial Fibrillation Lab Used: [facility] Site: [address] PT 17.2 INR POC 1.42 INR RANGE 2.0-3.0  Dietary changes: no    Health status changes: no    Bleeding/hemorrhagic complications: no    Recent/future hospitalizations: no    Any changes in medication regimen? no    Recent/future dental: no  Any missed doses?: no       Is patient compliant with meds? yes       Allergies: No Known Drug Allergies  Anticoagulation Management History:      Her anticoagulation is being managed by telephone today.  Positive risk factors for bleeding include an age of 45 years or older, history of CVA/TIA, and presence of serious comorbidities.  The bleeding index is 'high risk'.  Positive CHADS2 values include History of CHF, History of HTN, Age > 100 years old, History of Diabetes, and Prior Stroke/CVA/TIA.  Her last INR was 1.8 ratio.  Prothrombin time is 17.2.  Anticoagulation responsible provider: Tenny Craw MD, Gunnar Fusi.  INR POC: 1.42.    Anticoagulation Management Assessment/Plan:      The patient's current anticoagulation dose is Warfarin sodium 2 mg tabs: as directed, Warfarin sodium 1 mg tabs: Use as directed by Anticoagualtion Clinic.  The target INR is 2.0-3.0.  The next INR is due 09/22/2010.  Anticoagulation instructions were given to daughter.  Results were reviewed/authorized by Bethena Midget, RN, BSN.  She was notified by Bethena Midget, RN, BSN.         Prior Anticoagulation Instructions: INR 1.5 Change dose 1mg s daily except 2mg s on MWF. Recheck INR on 09/08/10 orders given to [facility] nurse while at home.   Current Anticoagulation Instructions: INR 1.42 Tomorrow take 2 pills then resume 1 pill everyday except 2 pills on MWF. Orders faxed to Hospice.   Appended Document: Coumadin  Clinic Orders given to Lonna Duval, RN at [facility]

## 2010-10-12 NOTE — Miscellaneous (Signed)
Summary: Physician Interim Order Report  Physician Interim Order Report   Imported By: Roderic Ovens 10/02/2010 15:37:20  _____________________________________________________________________  External Attachment:    Type:   Image     Comment:   External Document

## 2010-10-12 NOTE — Letter (Signed)
Summary: Appointment - Reschedule  [employer], Main Office  [address]   [place], [place] [address]   Phone: [phone]  Fax: [phone]     September 20, 2010 MRN: [id number]   [facility] [address] [place], [place]  [address]   Dear Ms. Gallien,   Due to a change in our office schedule, your appointment on    10-13-2010                   at   11:45 a.m.  must be changed.  It is very important that we reach you to reschedule this appointment. We look forward to participating in your health care needs. Please contact us at the number listed above at your earliest convenience to reschedule this appointment.     Sincerely,      Lorne Skeens  [facility] Scheduling Team

## 2010-10-12 NOTE — Medication Information (Signed)
Summary: Coumadin Clinic  Anticoagulant Therapy  Managed by: Weston Brass, PharmD Referring MD: Jens Som MD, Arlys John PCP: Dr. Doran Durand Supervising MD: Daleen Squibb MD, Maisie Fus Indication 1: Atrial Fibrillation Lab Used: [facility] [place] Site: [address] PT 21.3 INR POC 1.8 INR RANGE 2.0-3.0  Dietary changes: no    Health status changes: no    Bleeding/hemorrhagic complications: no    Recent/future hospitalizations: no    Any changes in medication regimen? no    Recent/future dental: no  Any missed doses?: no       Is patient compliant with meds? yes      Comments: HH RN reports pt's family has only been giving pt 2mg  daily.  Pt's family confired this.  They did 4mg  x 2 days then 2mg  daily.   Allergies: No Known Drug Allergies  Anticoagulation Management History:      The patient is taking warfarin and comes in today for a routine follow up visit.  Positive risk factors for bleeding include an age of 75 years or older, history of CVA/TIA, and presence of serious comorbidities.  The bleeding index is 'high risk'.  Positive CHADS2 values include History of CHF, History of HTN, Age > 62 years old, History of Diabetes, and Prior Stroke/CVA/TIA.  Her last INR was 1.8 ratio.  Prothrombin time is 21.3.  Anticoagulation responsible provider: Daleen Squibb MD, Maisie Fus.  INR POC: 1.8.  Exp: 06/2011.    Anticoagulation Management Assessment/Plan:      The patient's current anticoagulation dose is Warfarin sodium 2 mg tabs: as directed.  The target INR is 2.0-3.0.  The next INR is due 09/01/2010.  Anticoagulation instructions were given to daughter/Hospice.  Results were reviewed/authorized by Weston Brass, PharmD.  She was notified by Weston Brass PharmD.         Prior Anticoagulation Instructions: INR 1.7  Today take 2 pills then change dose to 1 pill everyday except 2 pills on Mondays and Fridays. Recheck in one week. Orders given to [facility] with Hospice.   Current Anticoagulation  Instructions: INR 1.8  Spoke with pt's caregiver, Luetta Nutting.  Take 2 tablets today and tomorrow then start new dose of 1 tablet every day except 2 tablets on Monday and Friday.  Recheck INR in 1 week.  Orders given to [facility] with Hospice.

## 2010-10-12 NOTE — Progress Notes (Signed)
Summary: unable to get blood draw  Phone Note From Other Clinic   Caller: [facility] OF [place]inette Otto [phone] Call For: Coumadin Clinic/ Dr Antoine Poche Request: Talk with Nurse Summary of Call: unable to get blood drawn x 3 . pt/ inr. pls advise Initial call taken by: Lorne Skeens,  September 22, 2010 12:48 PM  Follow-up for Phone Call        Called spoke with Lonna Duval, Hospice RN they have tried to get blood both by veinapuncture and by finger stick for coag machine.  Last INR 1.42 on 09/15/10.  Given pt's Hospice status will ask Dr Antoine Poche if pt should remain on Coumadin.  Please advise.  Thanks Follow-up by: Cloyde Reams RN,  September 22, 2010 3:40 PM  Additional Follow-up for Phone Call Additional follow up Details #1::        I would prefer  that she stay on the coumadin unless she is unable to come in to the office for coumadin checks. Additional Follow-up by: Rollene Rotunda, MD, [facility],  September 22, 2010 5:41 PM    Additional Follow-up for Phone Call Additional follow up Details #2::    Pt is homebound and is under the care of Hospice.  She has been unable to come into office for several months, but the Hospice nurse has been drawing her blood and checking her Coumadin and forwarding the results to [place].  However now Hospice is having difficulty getting pt's blood.  Follow-up by: Cloyde Reams RN,  September 25, 2010 9:00 AM  Additional Follow-up for Phone Call Additional follow up Details #3:: Details for Additional Follow-up Action Taken: If we cannot get blood draws at home and she cannot come into the office then we need to disontinue the coumadin. Additional Follow-up by: Rollene Rotunda, MD, [facility],  September 25, 2010 9:27 AM  Spoke with Waynetta Sandy, Triage RN with Hospice.  Relayed information from Dr. Antoine Poche.  She confirmed that pt unable to come to clinic on a regular basis for Coumadin visits.  Will d/c Coumadin at this time.

## 2010-10-12 NOTE — Progress Notes (Signed)
Summary: hospice calling re orders  Phone Note From Other Clinic   Caller: hospice [phone] triage Summary of Call: pt/inr done last week -no more orders -does she need any more done, if so needs order-pls call Initial call taken by: Glynda Jaeger,  September 14, 2010 11:06 AM  Follow-up for Phone Call        Spoke with Binnie Rail at [facility] last order for INR was for 09/08/10 given to nurse at home, INR result went to Dr Bufford Spikes per [facility] with orders to recheck in one week. Instructed Beth to have INR drawn tomorrow with results to [place] as the last result should have came to [facility]. Follow-up by: Bethena Midget, RN, BSN,  September 14, 2010 3:11 PM

## 2010-10-12 NOTE — Medication Information (Signed)
Summary: Coumadin Clinic  Anticoagulant Therapy  Managed by: Bethena Midget, RN, BSN Referring MD: Jens Som MD, Arlys John PCP: Dr. Doran Durand Supervising MD: Johney Frame MD, Fayrene Fearing Indication 1: Atrial Fibrillation Lab Used: [facility]  Site: [address] PT 20.4 INR POC 1.73 INR RANGE 2.0-3.0  Dietary changes: yes       Details: Poor  Appetite  Health status changes: no    Bleeding/hemorrhagic complications: no    Recent/future hospitalizations: no    Any changes in medication regimen? no    Recent/future dental: no  Any missed doses?: no       Is patient compliant with meds? yes      Comments: Taking 2mg s daily daughter states since 08/04/10 when she got out of hospital.   Allergies: No Known Drug Allergies  Anticoagulation Management History:      Her anticoagulation is being managed by telephone today.  Positive risk factors for bleeding include an age of 55 years or older, history of CVA/TIA, and presence of serious comorbidities.  The bleeding index is 'high risk'.  Positive CHADS2 values include History of CHF, History of HTN, Age > 74 years old, History of Diabetes, and Prior Stroke/CVA/TIA.  Her last INR was 1.8 ratio.  Prothrombin time is 20.4.  Anticoagulation responsible provider: Allred MD, Fayrene Fearing.  INR POC: 1.73.    Anticoagulation Management Assessment/Plan:      The patient's current anticoagulation dose is Warfarin sodium 2 mg tabs: as directed.  The target INR is 2.0-3.0.  The next INR is due 08/25/2010.  Anticoagulation instructions were given to daughter/Hospice.  Results were reviewed/authorized by Bethena Midget, RN, BSN.  She was notified by Bethena Midget, RN, BSN.         Prior Anticoagulation Instructions: INR 2.7  Spoke with pt's daughter.  Pt is currently admitted to hospital in [facility]  Asked daughter to have someone call us when pt is discharged so we can f/u INR.   Current Anticoagulation Instructions: INR 1.7  Today take 2 pills  then change dose to 1 pill everyday except 2 pills on Mondays and Fridays. Recheck in one week. Orders given to [facility] with Hospice.

## 2010-10-12 NOTE — Medication Information (Signed)
Summary: Coumadin Clinic  Anticoagulant Therapy  Managed by: Inactive Referring MD: Jens Som MD, Arlys John PCP: Dr. Doran Durand Supervising MD: Tenny Craw MD, Gunnar Fusi Indication 1: Atrial Fibrillation Lab Used: [facility] [place] Site: [address] INR RANGE 2.0-3.0          Comments: Pt's Coumadin d/c secondary to ablility to get INRs checked.  Please see phone note.   Allergies: No Known Drug Allergies  Anticoagulation Management History:      Positive risk factors for bleeding include an age of 16 years or older, history of CVA/TIA, and presence of serious comorbidities.  The bleeding index is 'high risk'.  Positive CHADS2 values include History of CHF, History of HTN, Age > 47 years old, History of Diabetes, and Prior Stroke/CVA/TIA.  Her last INR was 1.8 ratio.  Anticoagulation responsible provider: Tenny Craw MD, Gunnar Fusi.  Exp: 06/2011.    Anticoagulation Management Assessment/Plan:      The patient's current anticoagulation dose is Warfarin sodium 1 mg tabs: Use as directed by Anticoagualtion Clinic.  The target INR is 2.0-3.0.  The next INR is due 09/22/2010.  Anticoagulation instructions were given to daughter.  Results were reviewed/authorized by Inactive.         Prior Anticoagulation Instructions: INR 1.42 Tomorrow take 2 pills then resume 1 pill everyday except 2 pills on MWF. Orders faxed to Hospice.

## 2010-10-12 NOTE — Letter (Signed)
Summary: Hospice & Palliative  [facility] & Palliative  [facility] [place]   Imported By: Cala Bradford Mesiemore 09/06/2010 11:42:51  _____________________________________________________________________  External Attachment:    Type:   Image     Comment:   External Document  Appended Document: Hospice & Palliative  [facility] and Pallative Care papers faxed...km

## 2010-10-13 ENCOUNTER — Ambulatory Visit (INDEPENDENT_AMBULATORY_CARE_PROVIDER_SITE_OTHER): Payer: Medicare Other | Admitting: Cardiology

## 2010-10-13 ENCOUNTER — Encounter: Payer: Self-pay | Admitting: Cardiology

## 2010-10-13 ENCOUNTER — Ambulatory Visit: Admit: 2010-10-13 | Payer: Self-pay | Admitting: Cardiology

## 2010-10-13 ENCOUNTER — Encounter: Payer: Self-pay | Admitting: Internal Medicine

## 2010-10-13 DIAGNOSIS — I5031 Acute diastolic (congestive) heart failure: Secondary | ICD-10-CM

## 2010-10-13 DIAGNOSIS — I5033 Acute on chronic diastolic (congestive) heart failure: Secondary | ICD-10-CM

## 2010-10-13 DIAGNOSIS — I495 Sick sinus syndrome: Secondary | ICD-10-CM

## 2010-10-13 DIAGNOSIS — I2510 Atherosclerotic heart disease of native coronary artery without angina pectoris: Secondary | ICD-10-CM

## 2010-10-13 DIAGNOSIS — Z950 Presence of cardiac pacemaker: Secondary | ICD-10-CM

## 2010-10-13 DIAGNOSIS — I4891 Unspecified atrial fibrillation: Secondary | ICD-10-CM

## 2010-10-14 NOTE — Assessment & Plan Note (Signed)
She is euvolemic and she will continue the meds as listed.

## 2010-10-14 NOTE — Patient Instructions (Signed)
Continue current therapy.  Call with further questions.

## 2010-10-14 NOTE — Progress Notes (Signed)
Subjective:      Patient ID: Taylor Marquez is a 77 y.o. female.  Chief Complaint: HPI The patient presents for follow up of diastolic HF.  Since I last saw her she has been followed by home hospice and with a nurses aid.  She has been getting along well.  She has had no acute complaints and she denies chest pain, neck or arm discomfort.  She has had no new SOB, PND or orthopnea.  She does do some ambulation in her home with a walker.  She has had no weight gain or edema.  She is compliant with salt and fluid restriction.  She has had no palpitations pre syncope or syncope. Review of Systems  Constitution: Negative for decreased appetite, fever and malaise/fatigue.  HENT: Negative.   Eyes: Negative for visual disturbance.  Cardiovascular: Negative for chest pain, claudication, dyspnea on exertion, irregular heartbeat, leg swelling, near-syncope, orthopnea, palpitations, paroxysmal nocturnal dyspnia and syncope.  Respiratory: Negative for cough, hemoptysis, shortness of breath and wheezing.   Endocrine: Negative for cold intolerance and heat intolerance.  Hematologic/Lymphatic: Negative for adenopathy and bleeding problem.  Skin: Negative.   Musculoskeletal: Positive for muscle weakness. Negative for falls, muscle cramps and myalgias.  Gastrointestinal: Negative for abdominal pain, constipation, diarrhea, hematemesis, melena and nausea.  Genitourinary: Negative for flank pain and frequency.  Neurological: Negative for excessive daytime sleepiness, dizziness and focal weakness.  Psychiatric/Behavioral: Negative for altered mental status and depression.      Objective:    Physical Exam  Constitutional: No distress. She appears chronically ill.  HENT:  Mouth/Throat: No dental caries (Edentulous).  Eyes: Pupils are equal, round, and reactive to light.  Neck: No JVD (at 90 degress) present. No adenopathy.  Cardiovascular: Regular rhythm and normal pulses.  PMI is displaced (unable to assess  with the patient seated).  Exam reveals distant heart sounds. Exam reveals no gallop, no S3, no S4, no friction rub and no midsystolic click.   No murmur heard. Pulmonary/Chest: She exhibits no tenderness.  Abdominal: Soft. She exhibits no distension and no mass (unable to assess mass or organomeally with the patient seated). There is no tenderness.  Musculoskeletal: She exhibits deformity (diffuse muscle wasting).  Neurological: She exhibits a cognitive deficit.  Skin: Skin is warm.    Lab Review:  not applicable    Assessment:     No diagnosis found.   Plan:

## 2010-10-14 NOTE — Assessment & Plan Note (Signed)
She has no angina.  She will continue meds as listed.

## 2010-10-14 NOTE — Assessment & Plan Note (Signed)
I have had a long discussion with the patient and her daughters.  She cannot take the warfarin because she cannot get lab draws at home and she cannot come in for lab draws.  She is not a candidate for Pradaxa because of age and renal dysfunction.  She will be on ASA only

## 2010-10-14 NOTE — Assessment & Plan Note (Signed)
I reviewed the recent tracings and she had no atrial fib (mode switching).  She has normal function.  No change in therapy is indicated.

## 2010-10-18 NOTE — Assessment & Plan Note (Signed)
Summary: 2 month.dm/rs from [facility].mb/sp   Vital Signs:  Patient profile:   77 year old female Height:      67 inches Weight:      136 pounds BMI:     21.38 Pulse rate:   68 / minute Resp:     16 per minute BP sitting:   135 / 69  (right arm)  Vitals Entered By: Marrion Coy, CNA (October 13, 2010 11:56 AM)  Visit Type:  Follow-up Primary Provider:  Dr. Doran Durand  CC:  CHF.  History of Present Illness: The patient presents for followup of mixed systolic and diastolic heart failure. She has continued to do well at home being followed by hospice in the home a period she does some walking.  She denies any new cardiovascular symptoms and in particular has no PND or orthopnea. She gets around in her house with a walker. She denies any chest pressure, neck or arm discomfort.  Of note, since our last visit we have been unable to get her Coumadin checked. Home health nursing has been unable to draw blood and the patient cannot make it for Coumadin appointments in the office. Therefore, we stopped the Coumadin. I don't think she is a candidate for Pradaxa because of her renal function and advanced age.  She has had no palpitations, presyncope or syncope.   Current Medications (verified): 1)  Aspirin 81 Mg  Tabs (Aspirin) .... Daily 2)  Metoprolol Tartrate 50 Mg Tabs (Metoprolol Tartrate) .Marland Kitchen.. 1 By Mouth Two Times A Day 3)  Nova Max Glucose Test  Strp (Glucose Blood) .... Check Sugar Two Times A Day 4)  Acetaminophen 325 Mg  Tabs (Acetaminophen) .... As Needed 5)  Furosemide 40 Mg Tabs (Furosemide) .Marland Kitchen.. 1 By Mouth Daily 6)  Klor-Con M10 10 Meq Cr-Tabs (Potassium Chloride Crys Cr) .Marland Kitchen.. 1 By Mouth Daily 7)  Benadryl 25 Mg Caps (Diphenhydramine Hcl) .... As Needed 8)  Niacin 500 Mg Tabs (Niacin) .Marland Kitchen.. 1 By Mouth Daily  Allergies (verified): No Known Drug Allergies  Past History:  Past Medical History: Reviewed history from 08/11/2010 and no changes required.  1. Coronary disease.   2. Pulmonary hypertension.   3. Congestive heart failure.   4. Ejection fraction of 45% mild  diastolic dysfunction.   5. Diabetes.   6. Dyslipidemia.   7. CVA.   8. Tachybradycardia syndrome status post permanent pacemaker placement  9. Chronic renal insufficiency     Past Surgical History: CABG.  Total hip arthroplasty.  Aneurysm repair.   Review of Systems       As stated in the HPI and negative for all other systems.   Physical Exam  General:  Well developed, well nourished, in no acute distress. Head:  normocephalic and atraumatic Eyes:  PERRLA/EOM intact; conjunctiva and lids normal. Mouth:  Edentulous, gums and palate normal. Oral mucosa normal. Neck:  Neck supple, no JVD at 90. No masses, thyromegaly or abnormal cervical nodes. Chest Wall:  no deformities or breast masses noted, well healed sternotomy scar, well-healed pacemaker pocket Lungs:  Clear bilaterally to auscultation and percussion. Abdomen:  Bowel sounds positive; abdomen soft and non-tender , unable to appreciate organomegaly or midline pulsatile mass with the patient seated Msk:  diffusely diminished muscle mass and strength Extremities:  moderate bilateral lower extremity edema to the knees Neurologic:  Alert and oriented x 3. Skin:  Intact without lesions or rashes. Cervical Nodes:  no significant adenopathy Psych:  Normal affect.   Detailed Cardiovascular  Exam  Neck    Carotids: Carotids full and equal bilaterally without bruits.      Neck Veins: +JVD.  Up to ears.  Heart    Inspection: no deformities or lifts noted.      Palpation: normal PMI with no thrills palpable.      Auscultation: regular rate and rhythm, S1, S2 without murmurs, rubs, gallops, or clicks.    Vascular    Abdominal Aorta: no palpable masses, pulsations, or audible bruits.      Femoral Pulses: normal femoral pulses bilaterally.      Pedal Pulses: 2 both upper pulses, diminished dorsalis pedis and posterior tibialis     Radial Pulses: normal radial pulses bilaterally.      Peripheral Circulation: pedal edema, bilateral mild   Impression & Recommendations:  Problem # 1:  ATRIAL FIBRILLATION (ICD-427.31) The patient has tachybradycardia syndrome. However, I reviewed her pacemaker tracings today and there have been no mode switches or high rate detections. She cannot take Coumadin as explained above. I think Pradaxa has a relative contraindication in this situation.  I think she should remain on the ASA alone.  Her pacemaker is functioning normally.  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  Problem # 3:  DIASTOLIC HEART FAILURE, ACUTE ON CHRONIC (ICD-428.33) She seems to be euvolemic and she will continue meds as listed.  Patient Instructions: 1)  Your physician recommends that you schedule a follow-up appointment in: 12 months with Dr Antoine Poche 2)  Your physician recommends that you continue on your current medications as directed. Please refer to the Current Medication list given to you today.

## 2010-10-26 NOTE — Procedures (Signed)
Summary: Cardiology Device Clinic   Current Medications (verified): 1)  Aspirin 81 Mg  Tabs (Aspirin) .... Daily 2)  Metoprolol Tartrate 50 Mg Tabs (Metoprolol Tartrate) .Marland Kitchen.. 1 By Mouth Two Times A Day 3)  Nova Max Glucose Test  Strp (Glucose Blood) .... Check Sugar Two Times A Day 4)  Acetaminophen 325 Mg  Tabs (Acetaminophen) .... As Needed 5)  Furosemide 40 Mg Tabs (Furosemide) .Marland Kitchen.. 1 By Mouth Daily 6)  Klor-Con M10 10 Meq Cr-Tabs (Potassium Chloride Crys Cr) .Marland Kitchen.. 1 By Mouth Daily 7)  Benadryl 25 Mg Caps (Diphenhydramine Hcl) .... As Needed 8)  Niacin 500 Mg Tabs (Niacin) .Marland Kitchen.. 1 By Mouth Daily  Allergies (verified): No Known Drug Allergies  PPM Specifications Following MD:  Rollene Rotunda, MD     PPM Vendor:  [employer]     PPM Model Number:  [id number]     PPM Serial Number:  [id number] PPM DOI:  09/06/2009     PPM Implanting MD:  Hillis Range, MD  Lead 1    Location: RA     DOI: 09/06/2009     Model #: [id number]     Serial #: [id number]     Status: active Lead 2    Location: RV     DOI: 09/06/2009     Model #: [id number]     Serial #: [id number]     Status: active  Magnet Response Rate:  BOL 85 ERI  65  Indications:  Sick sinus syndrome   PPM Follow Up Battery Voltage:  2.80 V     Battery Est. Longevity:  11.5 YRS     Pacer Dependent:  No       PPM Device Measurements Atrium  Amplitude: 2.00 mV, Impedance: 461 ohms, Threshold: 0.50 V at 0.40 msec Right Ventricle  Amplitude: 5.60 mV, Impedance: 419 ohms, Threshold: 0.50 V at 0.40 msec  Episodes MS Episodes:  0     Coumadin:  Yes Ventricular High Rate:  0     Atrial Pacing:  89.7%     Ventricular Pacing:  1.5%  Parameters Mode:  MVP     Lower Rate Limit:  60     Upper Rate Limit:  130 Paced AV Delay:  150     Sensed AV Delay:  120 Next Cardiology Appt Due:  04/11/2011 Tech Comments:  NORMAL DEVICE FUNCTION.  NO EPISODES SINCE LAST CHECK.  CHANGED RA OUTPUT FROM 1.5 TO 2.0 AND RV OUTPUT FROM 2.0 TO 2.5 V.   ROV IN 6 MTHS W/DEVICE  CLINIC. Vella Kohler  October 15, 2010 1:42 PM

## 2010-11-01 NOTE — Miscellaneous (Signed)
Summary: Physician Interim Order Report   Physician Interim Order Report   Imported By: Roderic Ovens 10/23/2010 13:21:51  _____________________________________________________________________  External Attachment:    Type:   Image     Comment:   External Document

## 2010-11-01 NOTE — Cardiovascular Report (Signed)
Summary: Office Visit   Office Visit   Imported By: Roderic Ovens 10/24/2010 14:29:03  _____________________________________________________________________  External Attachment:    Type:   Image     Comment:   External Document

## 2010-11-07 NOTE — Miscellaneous (Signed)
Summary: Physicians Interim Order Report  Physicians Interim Order Report   Imported By: Marylou Mccoy 11/02/2010 11:44:41  _____________________________________________________________________  External Attachment:    Type:   Image     Comment:   External Document

## 2010-11-07 NOTE — Miscellaneous (Signed)
Summary: Physicians Interim Order Report  Physicians Interim Order Report   Imported By: Marylou Mccoy 11/02/2010 11:47:11  _____________________________________________________________________  External Attachment:    Type:   Image     Comment:   External Document

## 2010-11-07 NOTE — Miscellaneous (Signed)
Summary: Physicians Interim Order Report  Physicians Interim Order Report   Imported By: Marylou Mccoy 11/02/2010 11:49:16  _____________________________________________________________________  External Attachment:    Type:   Image     Comment:   External Document

## 2010-11-26 LAB — RENAL FUNCTION PANEL
BUN: 33 mg/dL — ABNORMAL HIGH (ref 6–23)
CO2: 26 mEq/L (ref 19–32)
CO2: 27 mEq/L (ref 19–32)
Calcium: 10.1 mg/dL (ref 8.4–10.5)
Calcium: 10.1 mg/dL (ref 8.4–10.5)
Creatinine, Ser: 2.99 mg/dL — ABNORMAL HIGH (ref 0.4–1.2)
Creatinine, Ser: 3.41 mg/dL — ABNORMAL HIGH (ref 0.4–1.2)
GFR calc Af Amer: 18 mL/min — ABNORMAL LOW (ref >60)
GFR calc non Af Amer: 15 mL/min — ABNORMAL LOW (ref >60)
Glucose, Bld: 100 mg/dL — ABNORMAL HIGH (ref 70–99)

## 2010-11-26 LAB — BASIC METABOLIC PANEL
BUN: 24 mg/dL — ABNORMAL HIGH (ref 6–23)
BUN: 26 mg/dL — ABNORMAL HIGH (ref 6–23)
BUN: 35 mg/dL — ABNORMAL HIGH (ref 6–23)
CO2: 31 mEq/L (ref 19–32)
Calcium: 10.1 mg/dL (ref 8.4–10.5)
Calcium: 10.4 mg/dL (ref 8.4–10.5)
Chloride: 100 mEq/L (ref 96–112)
Chloride: 101 mEq/L (ref 96–112)
Chloride: 97 mEq/L (ref 96–112)
Creatinine, Ser: 2.54 mg/dL — ABNORMAL HIGH (ref 0.4–1.2)
Creatinine, Ser: 2.86 mg/dL — ABNORMAL HIGH (ref 0.4–1.2)
Creatinine, Ser: 4.28 mg/dL — ABNORMAL HIGH (ref 0.4–1.2)
GFR calc Af Amer: 13 mL/min — ABNORMAL LOW (ref >60)
GFR calc Af Amer: 19 mL/min — ABNORMAL LOW (ref >60)
GFR calc Af Amer: 22 mL/min — ABNORMAL LOW (ref >60)
GFR calc non Af Amer: 10 mL/min — ABNORMAL LOW (ref >60)
GFR calc non Af Amer: 11 mL/min — ABNORMAL LOW (ref >60)
GFR calc non Af Amer: 16 mL/min — ABNORMAL LOW (ref >60)
Glucose, Bld: 109 mg/dL — ABNORMAL HIGH (ref 70–99)
Glucose, Bld: 139 mg/dL — ABNORMAL HIGH (ref 70–99)
Glucose, Bld: 97 mg/dL (ref 70–99)
Potassium: 3.6 mEq/L (ref 3.5–5.1)
Potassium: 3.7 mEq/L (ref 3.5–5.1)
Potassium: 4 mEq/L (ref 3.5–5.1)
Sodium: 135 mEq/L (ref 135–145)
Sodium: 138 mEq/L (ref 135–145)

## 2010-11-26 LAB — CBC
HCT: 36.4 % (ref 36.0–46.0)
HCT: 37 % (ref 36.0–46.0)
Hemoglobin: 12.2 g/dL (ref 12.0–15.0)
Hemoglobin: 12.4 g/dL (ref 12.0–15.0)
MCHC: 32.7 g/dL (ref 30.0–36.0)
Platelets: 165 10*3/uL (ref 150–400)
Platelets: 173 10*3/uL (ref 150–400)
Platelets: 178 10*3/uL (ref 150–400)
RBC: 3.8 MIL/uL — ABNORMAL LOW (ref 3.87–5.11)
RBC: 3.87 MIL/uL (ref 3.87–5.11)
RDW: 18 % — ABNORMAL HIGH (ref 11.5–15.5)
RDW: 18.2 % — ABNORMAL HIGH (ref 11.5–15.5)
RDW: 18.4 % — ABNORMAL HIGH (ref 11.5–15.5)
WBC: 7.4 10*3/uL (ref 4.0–10.5)
WBC: 7.7 10*3/uL (ref 4.0–10.5)

## 2010-11-26 LAB — GLUCOSE, CAPILLARY
Glucose-Capillary: 100 mg/dL — ABNORMAL HIGH (ref 70–99)
Glucose-Capillary: 106 mg/dL — ABNORMAL HIGH (ref 70–99)
Glucose-Capillary: 106 mg/dL — ABNORMAL HIGH (ref 70–99)
Glucose-Capillary: 108 mg/dL — ABNORMAL HIGH (ref 70–99)
Glucose-Capillary: 109 mg/dL — ABNORMAL HIGH (ref 70–99)
Glucose-Capillary: 111 mg/dL — ABNORMAL HIGH (ref 70–99)
Glucose-Capillary: 111 mg/dL — ABNORMAL HIGH (ref 70–99)
Glucose-Capillary: 111 mg/dL — ABNORMAL HIGH (ref 70–99)
Glucose-Capillary: 114 mg/dL — ABNORMAL HIGH (ref 70–99)
Glucose-Capillary: 115 mg/dL — ABNORMAL HIGH (ref 70–99)
Glucose-Capillary: 116 mg/dL — ABNORMAL HIGH (ref 70–99)
Glucose-Capillary: 117 mg/dL — ABNORMAL HIGH (ref 70–99)
Glucose-Capillary: 120 mg/dL — ABNORMAL HIGH (ref 70–99)
Glucose-Capillary: 122 mg/dL — ABNORMAL HIGH (ref 70–99)
Glucose-Capillary: 124 mg/dL — ABNORMAL HIGH (ref 70–99)
Glucose-Capillary: 125 mg/dL — ABNORMAL HIGH (ref 70–99)
Glucose-Capillary: 136 mg/dL — ABNORMAL HIGH (ref 70–99)
Glucose-Capillary: 137 mg/dL — ABNORMAL HIGH (ref 70–99)
Glucose-Capillary: 144 mg/dL — ABNORMAL HIGH (ref 70–99)
Glucose-Capillary: 180 mg/dL — ABNORMAL HIGH (ref 70–99)
Glucose-Capillary: 87 mg/dL (ref 70–99)
Glucose-Capillary: 90 mg/dL (ref 70–99)
Glucose-Capillary: 92 mg/dL (ref 70–99)
Glucose-Capillary: 94 mg/dL (ref 70–99)
Glucose-Capillary: 98 mg/dL (ref 70–99)

## 2010-11-26 LAB — URINALYSIS, ROUTINE W REFLEX MICROSCOPIC
Bilirubin Urine: NEGATIVE
Glucose, UA: NEGATIVE mg/dL
Ketones, ur: NEGATIVE mg/dL
Leukocytes, UA: NEGATIVE
Protein, ur: NEGATIVE mg/dL

## 2010-11-26 LAB — CLOSTRIDIUM DIFFICILE EIA: C difficile Toxins A+B, EIA: NEGATIVE

## 2010-11-26 LAB — PROTIME-INR
INR: 2.1 — ABNORMAL HIGH (ref 0.00–1.49)
INR: 2.36 — ABNORMAL HIGH (ref 0.00–1.49)
INR: 2.67 — ABNORMAL HIGH (ref 0.00–1.49)
INR: 3.07 — ABNORMAL HIGH (ref 0.00–1.49)
Prothrombin Time: 23.4 seconds — ABNORMAL HIGH (ref 11.6–15.2)
Prothrombin Time: 25.1 seconds — ABNORMAL HIGH (ref 11.6–15.2)
Prothrombin Time: 25.4 seconds — ABNORMAL HIGH (ref 11.6–15.2)
Prothrombin Time: 26.3 seconds — ABNORMAL HIGH (ref 11.6–15.2)
Prothrombin Time: 28.2 seconds — ABNORMAL HIGH (ref 11.6–15.2)
Prothrombin Time: 31.5 seconds — ABNORMAL HIGH (ref 11.6–15.2)
Prothrombin Time: 32.8 seconds — ABNORMAL HIGH (ref 11.6–15.2)

## 2010-11-26 LAB — COMPREHENSIVE METABOLIC PANEL
ALT: <8 U/L (ref 0–35)
AST: 24 U/L (ref 0–37)
Alkaline Phosphatase: 34 U/L — ABNORMAL LOW (ref 39–117)
CO2: 29 mEq/L (ref 19–32)
Chloride: 102 mEq/L (ref 96–112)
GFR calc Af Amer: 18 mL/min — ABNORMAL LOW (ref >60)
GFR calc non Af Amer: 15 mL/min — ABNORMAL LOW (ref >60)
Glucose, Bld: 89 mg/dL (ref 70–99)
Potassium: 4.2 mEq/L (ref 3.5–5.1)
Sodium: 140 mEq/L (ref 135–145)
Total Bilirubin: 0.9 mg/dL (ref 0.3–1.2)

## 2010-11-26 LAB — URINE MICROSCOPIC-ADD ON

## 2010-11-26 LAB — URINE CULTURE: Colony Count: NO GROWTH

## 2010-11-26 LAB — BLOOD GAS, ARTERIAL
Acid-Base Excess: 1.7 mmol/L (ref 0.0–2.0)
O2 Saturation: 98.9 %
TCO2: 28.3 mmol/L (ref 0–100)
pCO2 arterial: 50.3 mmHg — ABNORMAL HIGH (ref 35.0–45.0)
pO2, Arterial: 118 mmHg — ABNORMAL HIGH (ref 80.0–100.0)

## 2010-12-11 LAB — GLUCOSE, CAPILLARY
Glucose-Capillary: 100 mg/dL — ABNORMAL HIGH (ref 70–99)
Glucose-Capillary: 105 mg/dL — ABNORMAL HIGH (ref 70–99)
Glucose-Capillary: 107 mg/dL — ABNORMAL HIGH (ref 70–99)
Glucose-Capillary: 110 mg/dL — ABNORMAL HIGH (ref 70–99)
Glucose-Capillary: 113 mg/dL — ABNORMAL HIGH (ref 70–99)
Glucose-Capillary: 116 mg/dL — ABNORMAL HIGH (ref 70–99)
Glucose-Capillary: 119 mg/dL — ABNORMAL HIGH (ref 70–99)
Glucose-Capillary: 121 mg/dL — ABNORMAL HIGH (ref 70–99)
Glucose-Capillary: 123 mg/dL — ABNORMAL HIGH (ref 70–99)
Glucose-Capillary: 124 mg/dL — ABNORMAL HIGH (ref 70–99)
Glucose-Capillary: 125 mg/dL — ABNORMAL HIGH (ref 70–99)
Glucose-Capillary: 125 mg/dL — ABNORMAL HIGH (ref 70–99)
Glucose-Capillary: 126 mg/dL — ABNORMAL HIGH (ref 70–99)
Glucose-Capillary: 127 mg/dL — ABNORMAL HIGH (ref 70–99)
Glucose-Capillary: 127 mg/dL — ABNORMAL HIGH (ref 70–99)
Glucose-Capillary: 128 mg/dL — ABNORMAL HIGH (ref 70–99)
Glucose-Capillary: 131 mg/dL — ABNORMAL HIGH (ref 70–99)
Glucose-Capillary: 134 mg/dL — ABNORMAL HIGH (ref 70–99)
Glucose-Capillary: 135 mg/dL — ABNORMAL HIGH (ref 70–99)
Glucose-Capillary: 136 mg/dL — ABNORMAL HIGH (ref 70–99)
Glucose-Capillary: 137 mg/dL — ABNORMAL HIGH (ref 70–99)
Glucose-Capillary: 138 mg/dL — ABNORMAL HIGH (ref 70–99)
Glucose-Capillary: 139 mg/dL — ABNORMAL HIGH (ref 70–99)
Glucose-Capillary: 139 mg/dL — ABNORMAL HIGH (ref 70–99)
Glucose-Capillary: 141 mg/dL — ABNORMAL HIGH (ref 70–99)
Glucose-Capillary: 141 mg/dL — ABNORMAL HIGH (ref 70–99)
Glucose-Capillary: 142 mg/dL — ABNORMAL HIGH (ref 70–99)
Glucose-Capillary: 143 mg/dL — ABNORMAL HIGH (ref 70–99)
Glucose-Capillary: 144 mg/dL — ABNORMAL HIGH (ref 70–99)
Glucose-Capillary: 148 mg/dL — ABNORMAL HIGH (ref 70–99)
Glucose-Capillary: 153 mg/dL — ABNORMAL HIGH (ref 70–99)
Glucose-Capillary: 180 mg/dL — ABNORMAL HIGH (ref 70–99)
Glucose-Capillary: 181 mg/dL — ABNORMAL HIGH (ref 70–99)
Glucose-Capillary: 94 mg/dL (ref 70–99)

## 2010-12-11 LAB — CBC
HCT: 36.9 % (ref 36.0–46.0)
HCT: 36.9 % (ref 36.0–46.0)
HCT: 41.7 % (ref 36.0–46.0)
MCHC: 32.3 g/dL (ref 30.0–36.0)
MCHC: 33.1 g/dL (ref 30.0–36.0)
MCHC: 33.1 g/dL (ref 30.0–36.0)
MCV: 96.2 fL (ref 78.0–100.0)
MCV: 97.1 fL (ref 78.0–100.0)
Platelets: 125 10*3/uL — ABNORMAL LOW (ref 150–400)
Platelets: 127 10*3/uL — ABNORMAL LOW (ref 150–400)
Platelets: 139 10*3/uL — ABNORMAL LOW (ref 150–400)
Platelets: 147 10*3/uL — ABNORMAL LOW (ref 150–400)
RBC: 3.78 MIL/uL — ABNORMAL LOW (ref 3.87–5.11)
RDW: 18.9 % — ABNORMAL HIGH (ref 11.5–15.5)
RDW: 19 % — ABNORMAL HIGH (ref 11.5–15.5)
RDW: 19.3 % — ABNORMAL HIGH (ref 11.5–15.5)
RDW: 19.6 % — ABNORMAL HIGH (ref 11.5–15.5)
WBC: 6.4 10*3/uL (ref 4.0–10.5)
WBC: 7.5 10*3/uL (ref 4.0–10.5)

## 2010-12-11 LAB — BASIC METABOLIC PANEL
BUN: 22 mg/dL (ref 6–23)
BUN: 29 mg/dL — ABNORMAL HIGH (ref 6–23)
BUN: 31 mg/dL — ABNORMAL HIGH (ref 6–23)
BUN: 36 mg/dL — ABNORMAL HIGH (ref 6–23)
BUN: 54 mg/dL — ABNORMAL HIGH (ref 6–23)
BUN: 62 mg/dL — ABNORMAL HIGH (ref 6–23)
CO2: 25 mEq/L (ref 19–32)
CO2: 27 mEq/L (ref 19–32)
CO2: 30 mEq/L (ref 19–32)
CO2: 31 mEq/L (ref 19–32)
CO2: 34 mEq/L — ABNORMAL HIGH (ref 19–32)
Calcium: 10 mg/dL (ref 8.4–10.5)
Calcium: 9.3 mg/dL (ref 8.4–10.5)
Calcium: 9.4 mg/dL (ref 8.4–10.5)
Calcium: 9.4 mg/dL (ref 8.4–10.5)
Calcium: 9.5 mg/dL (ref 8.4–10.5)
Calcium: 9.5 mg/dL (ref 8.4–10.5)
Calcium: 9.8 mg/dL (ref 8.4–10.5)
Chloride: 100 mEq/L (ref 96–112)
Chloride: 100 mEq/L (ref 96–112)
Chloride: 100 mEq/L (ref 96–112)
Chloride: 102 mEq/L (ref 96–112)
Chloride: 97 mEq/L (ref 96–112)
Chloride: 99 mEq/L (ref 96–112)
Creatinine, Ser: 1.64 mg/dL — ABNORMAL HIGH (ref 0.4–1.2)
Creatinine, Ser: 1.7 mg/dL — ABNORMAL HIGH (ref 0.4–1.2)
Creatinine, Ser: 1.84 mg/dL — ABNORMAL HIGH (ref 0.4–1.2)
Creatinine, Ser: 3 mg/dL — ABNORMAL HIGH (ref 0.4–1.2)
Creatinine, Ser: 3.69 mg/dL — ABNORMAL HIGH (ref 0.4–1.2)
Creatinine, Ser: 3.89 mg/dL — ABNORMAL HIGH (ref 0.4–1.2)
Creatinine, Ser: 4.01 mg/dL — ABNORMAL HIGH (ref 0.4–1.2)
GFR calc Af Amer: 13 mL/min — ABNORMAL LOW (ref >60)
GFR calc Af Amer: 14 mL/min — ABNORMAL LOW (ref >60)
GFR calc Af Amer: 18 mL/min — ABNORMAL LOW (ref >60)
GFR calc Af Amer: 34 mL/min — ABNORMAL LOW (ref >60)
GFR calc Af Amer: 41 mL/min — ABNORMAL LOW (ref >60)
GFR calc non Af Amer: 10 mL/min — ABNORMAL LOW (ref >60)
GFR calc non Af Amer: 11 mL/min — ABNORMAL LOW (ref >60)
GFR calc non Af Amer: 13 mL/min — ABNORMAL LOW (ref >60)
GFR calc non Af Amer: 15 mL/min — ABNORMAL LOW (ref >60)
GFR calc non Af Amer: 26 mL/min — ABNORMAL LOW (ref >60)
GFR calc non Af Amer: 28 mL/min — ABNORMAL LOW (ref >60)
GFR calc non Af Amer: 34 mL/min — ABNORMAL LOW (ref >60)
Glucose, Bld: 101 mg/dL — ABNORMAL HIGH (ref 70–99)
Glucose, Bld: 108 mg/dL — ABNORMAL HIGH (ref 70–99)
Glucose, Bld: 110 mg/dL — ABNORMAL HIGH (ref 70–99)
Glucose, Bld: 120 mg/dL — ABNORMAL HIGH (ref 70–99)
Glucose, Bld: 125 mg/dL — ABNORMAL HIGH (ref 70–99)
Glucose, Bld: 132 mg/dL — ABNORMAL HIGH (ref 70–99)
Glucose, Bld: 134 mg/dL — ABNORMAL HIGH (ref 70–99)
Glucose, Bld: 136 mg/dL — ABNORMAL HIGH (ref 70–99)
Glucose, Bld: 162 mg/dL — ABNORMAL HIGH (ref 70–99)
Potassium: 4.3 mEq/L (ref 3.5–5.1)
Potassium: 4.3 mEq/L (ref 3.5–5.1)
Potassium: 4.4 mEq/L (ref 3.5–5.1)
Potassium: 4.6 mEq/L (ref 3.5–5.1)
Potassium: 4.9 mEq/L (ref 3.5–5.1)
Potassium: 5 mEq/L (ref 3.5–5.1)
Sodium: 133 mEq/L — ABNORMAL LOW (ref 135–145)
Sodium: 135 mEq/L (ref 135–145)
Sodium: 135 mEq/L (ref 135–145)
Sodium: 135 mEq/L (ref 135–145)
Sodium: 136 mEq/L (ref 135–145)
Sodium: 137 mEq/L (ref 135–145)
Sodium: 138 mEq/L (ref 135–145)

## 2010-12-11 LAB — COMPREHENSIVE METABOLIC PANEL
AST: 28 U/L (ref 0–37)
AST: 36 U/L (ref 0–37)
Albumin: 2.7 g/dL — ABNORMAL LOW (ref 3.5–5.2)
BUN: 50 mg/dL — ABNORMAL HIGH (ref 6–23)
BUN: 61 mg/dL — ABNORMAL HIGH (ref 6–23)
BUN: 75 mg/dL — ABNORMAL HIGH (ref 6–23)
CO2: 32 mEq/L (ref 19–32)
Calcium: 9.3 mg/dL (ref 8.4–10.5)
Calcium: 9.3 mg/dL (ref 8.4–10.5)
Calcium: 9.4 mg/dL (ref 8.4–10.5)
Creatinine, Ser: 2.16 mg/dL — ABNORMAL HIGH (ref 0.4–1.2)
Creatinine, Ser: 2.72 mg/dL — ABNORMAL HIGH (ref 0.4–1.2)
Creatinine, Ser: 4.21 mg/dL — ABNORMAL HIGH (ref 0.4–1.2)
GFR calc Af Amer: 20 mL/min — ABNORMAL LOW (ref >60)
GFR calc Af Amer: 26 mL/min — ABNORMAL LOW (ref >60)
GFR calc non Af Amer: 16 mL/min — ABNORMAL LOW (ref >60)
GFR calc non Af Amer: 22 mL/min — ABNORMAL LOW (ref >60)
Glucose, Bld: 107 mg/dL — ABNORMAL HIGH (ref 70–99)
Total Bilirubin: 1.5 mg/dL — ABNORMAL HIGH (ref 0.3–1.2)
Total Protein: 6.3 g/dL (ref 6.0–8.3)

## 2010-12-11 LAB — CLOSTRIDIUM DIFFICILE EIA
C difficile Toxins A+B, EIA: NEGATIVE
C difficile Toxins A+B, EIA: NEGATIVE

## 2010-12-11 LAB — URINALYSIS, MICROSCOPIC ONLY
Glucose, UA: NEGATIVE mg/dL
Ketones, ur: NEGATIVE mg/dL
Nitrite: NEGATIVE
Protein, ur: NEGATIVE mg/dL

## 2010-12-11 LAB — HEPARIN LEVEL (UNFRACTIONATED)
Heparin Unfractionated: 0.51 IU/mL (ref 0.30–0.70)
Heparin Unfractionated: 0.63 IU/mL (ref 0.30–0.70)
Heparin Unfractionated: 1.04 IU/mL — ABNORMAL HIGH (ref 0.30–0.70)

## 2010-12-11 LAB — PROTIME-INR
INR: 1.28 (ref 0.00–1.49)
INR: 1.32 (ref 0.00–1.49)
INR: 1.33 (ref 0.00–1.49)
INR: 2.07 — ABNORMAL HIGH (ref 0.00–1.49)
INR: 2.37 — ABNORMAL HIGH (ref 0.00–1.49)
INR: 2.93 — ABNORMAL HIGH (ref 0.00–1.49)
Prothrombin Time: 16.3 seconds — ABNORMAL HIGH (ref 11.6–15.2)
Prothrombin Time: 16.4 seconds — ABNORMAL HIGH (ref 11.6–15.2)
Prothrombin Time: 25.7 seconds — ABNORMAL HIGH (ref 11.6–15.2)
Prothrombin Time: 30.3 seconds — ABNORMAL HIGH (ref 11.6–15.2)

## 2010-12-11 LAB — BRAIN NATRIURETIC PEPTIDE
Pro B Natriuretic peptide (BNP): 1352 pg/mL — ABNORMAL HIGH (ref 0.0–100.0)
Pro B Natriuretic peptide (BNP): 1982 pg/mL — ABNORMAL HIGH (ref 0.0–100.0)

## 2010-12-11 LAB — SODIUM, URINE, RANDOM: Sodium, Ur: <10 mEq/L

## 2010-12-11 LAB — CREATININE, URINE, RANDOM: Creatinine, Urine: 120.6 mg/dL

## 2010-12-11 LAB — TSH: TSH: 1.698 u[IU]/mL (ref 0.350–4.500)

## 2010-12-11 LAB — PREALBUMIN: Prealbumin: 7.4 mg/dL — ABNORMAL LOW (ref 18.0–45.0)

## 2010-12-11 LAB — MAGNESIUM: Magnesium: 2.8 mg/dL — ABNORMAL HIGH (ref 1.5–2.5)

## 2010-12-12 LAB — BASIC METABOLIC PANEL
Calcium: 9.1 mg/dL (ref 8.4–10.5)
GFR calc Af Amer: 25 mL/min — ABNORMAL LOW (ref >60)
GFR calc non Af Amer: 21 mL/min — ABNORMAL LOW (ref >60)
Potassium: 4.3 mEq/L (ref 3.5–5.1)
Sodium: 132 mEq/L — ABNORMAL LOW (ref 135–145)

## 2010-12-12 LAB — GLUCOSE, CAPILLARY
Glucose-Capillary: 118 mg/dL — ABNORMAL HIGH (ref 70–99)
Glucose-Capillary: 121 mg/dL — ABNORMAL HIGH (ref 70–99)
Glucose-Capillary: 124 mg/dL — ABNORMAL HIGH (ref 70–99)
Glucose-Capillary: 156 mg/dL — ABNORMAL HIGH (ref 70–99)

## 2010-12-12 LAB — CBC
HCT: 36.6 % (ref 36.0–46.0)
Hemoglobin: 12.2 g/dL (ref 12.0–15.0)
Hemoglobin: 12.4 g/dL (ref 12.0–15.0)
MCHC: 33 g/dL (ref 30.0–36.0)
MCV: 94.5 fL (ref 78.0–100.0)
RBC: 3.91 MIL/uL (ref 3.87–5.11)
RBC: 3.99 MIL/uL (ref 3.87–5.11)
RDW: 16.8 % — ABNORMAL HIGH (ref 11.5–15.5)

## 2010-12-12 LAB — COMPREHENSIVE METABOLIC PANEL
CO2: 30 mEq/L (ref 19–32)
Calcium: 9.2 mg/dL (ref 8.4–10.5)
Creatinine, Ser: 2.16 mg/dL — ABNORMAL HIGH (ref 0.4–1.2)
GFR calc Af Amer: 26 mL/min — ABNORMAL LOW (ref >60)
GFR calc non Af Amer: 22 mL/min — ABNORMAL LOW (ref >60)
Glucose, Bld: 115 mg/dL — ABNORMAL HIGH (ref 70–99)
Total Protein: 6.8 g/dL (ref 6.0–8.3)

## 2010-12-12 LAB — BRAIN NATRIURETIC PEPTIDE: Pro B Natriuretic peptide (BNP): 1500 pg/mL — ABNORMAL HIGH (ref 0.0–100.0)

## 2010-12-14 LAB — GLUCOSE, CAPILLARY
Glucose-Capillary: 116 mg/dL — ABNORMAL HIGH (ref 70–99)
Glucose-Capillary: 131 mg/dL — ABNORMAL HIGH (ref 70–99)
Glucose-Capillary: 141 mg/dL — ABNORMAL HIGH (ref 70–99)
Glucose-Capillary: 146 mg/dL — ABNORMAL HIGH (ref 70–99)
Glucose-Capillary: 175 mg/dL — ABNORMAL HIGH (ref 70–99)
Glucose-Capillary: 185 mg/dL — ABNORMAL HIGH (ref 70–99)
Glucose-Capillary: 99 mg/dL (ref 70–99)

## 2010-12-14 LAB — COMPREHENSIVE METABOLIC PANEL
Alkaline Phosphatase: 81 U/L (ref 39–117)
BUN: 22 mg/dL (ref 6–23)
Calcium: 9.6 mg/dL (ref 8.4–10.5)
Creatinine, Ser: 1.5 mg/dL — ABNORMAL HIGH (ref 0.4–1.2)
Glucose, Bld: 140 mg/dL — ABNORMAL HIGH (ref 70–99)
Potassium: 4.6 mEq/L (ref 3.5–5.1)
Total Protein: 6.8 g/dL (ref 6.0–8.3)

## 2010-12-14 LAB — BASIC METABOLIC PANEL
BUN: 18 mg/dL (ref 6–23)
CO2: 27 mEq/L (ref 19–32)
Calcium: 9.7 mg/dL (ref 8.4–10.5)
Chloride: 100 mEq/L (ref 96–112)
GFR calc non Af Amer: 37 mL/min — ABNORMAL LOW (ref >60)
Glucose, Bld: 124 mg/dL — ABNORMAL HIGH (ref 70–99)
Glucose, Bld: 186 mg/dL — ABNORMAL HIGH (ref 70–99)
Glucose, Bld: 80 mg/dL (ref 70–99)
Potassium: 3.6 mEq/L (ref 3.5–5.1)
Potassium: 3.7 mEq/L (ref 3.5–5.1)
Sodium: 136 mEq/L (ref 135–145)
Sodium: 144 mEq/L (ref 135–145)

## 2010-12-14 LAB — BRAIN NATRIURETIC PEPTIDE
Pro B Natriuretic peptide (BNP): 1375 pg/mL — ABNORMAL HIGH (ref 0.0–100.0)
Pro B Natriuretic peptide (BNP): 1412 pg/mL — ABNORMAL HIGH (ref 0.0–100.0)
Pro B Natriuretic peptide (BNP): <30 pg/mL (ref 0.0–100.0)

## 2010-12-18 LAB — URINE MICROSCOPIC-ADD ON

## 2010-12-18 LAB — BRAIN NATRIURETIC PEPTIDE
Pro B Natriuretic peptide (BNP): 1040 pg/mL — ABNORMAL HIGH (ref 0.0–100.0)
Pro B Natriuretic peptide (BNP): 289 pg/mL — ABNORMAL HIGH (ref 0.0–100.0)
Pro B Natriuretic peptide (BNP): 1260 pg/mL — ABNORMAL HIGH (ref 0.0–100.0)

## 2010-12-18 LAB — GLUCOSE, CAPILLARY
Glucose-Capillary: 100 mg/dL — ABNORMAL HIGH (ref 70–99)
Glucose-Capillary: 115 mg/dL — ABNORMAL HIGH (ref 70–99)
Glucose-Capillary: 119 mg/dL — ABNORMAL HIGH (ref 70–99)
Glucose-Capillary: 124 mg/dL — ABNORMAL HIGH (ref 70–99)
Glucose-Capillary: 128 mg/dL — ABNORMAL HIGH (ref 70–99)
Glucose-Capillary: 138 mg/dL — ABNORMAL HIGH (ref 70–99)
Glucose-Capillary: 148 mg/dL — ABNORMAL HIGH (ref 70–99)
Glucose-Capillary: 155 mg/dL — ABNORMAL HIGH (ref 70–99)
Glucose-Capillary: 157 mg/dL — ABNORMAL HIGH (ref 70–99)
Glucose-Capillary: 170 mg/dL — ABNORMAL HIGH (ref 70–99)
Glucose-Capillary: 182 mg/dL — ABNORMAL HIGH (ref 70–99)

## 2010-12-18 LAB — BASIC METABOLIC PANEL
CO2: 29 mEq/L (ref 19–32)
CO2: 32 mEq/L (ref 19–32)
Calcium: 9.2 mg/dL (ref 8.4–10.5)
Chloride: 103 mEq/L (ref 96–112)
Chloride: 97 mEq/L (ref 96–112)
Creatinine, Ser: 1.27 mg/dL — ABNORMAL HIGH (ref 0.4–1.2)
Creatinine, Ser: 1.72 mg/dL — ABNORMAL HIGH (ref 0.4–1.2)
GFR calc Af Amer: 34 mL/min — ABNORMAL LOW (ref >60)
GFR calc Af Amer: 48 mL/min — ABNORMAL LOW (ref >60)
GFR calc Af Amer: 52 mL/min — ABNORMAL LOW (ref >60)
GFR calc non Af Amer: 40 mL/min — ABNORMAL LOW (ref >60)
Glucose, Bld: 127 mg/dL — ABNORMAL HIGH (ref 70–99)
Potassium: 3.8 mEq/L (ref 3.5–5.1)
Sodium: 141 mEq/L (ref 135–145)

## 2010-12-18 LAB — CBC
Hemoglobin: 12.4 g/dL (ref 12.0–15.0)
MCHC: 33.3 g/dL (ref 30.0–36.0)
RBC: 3.58 MIL/uL — ABNORMAL LOW (ref 3.87–5.11)
RBC: 4.01 MIL/uL (ref 3.87–5.11)
RBC: 4.08 MIL/uL (ref 3.87–5.11)
WBC: 8.6 10*3/uL (ref 4.0–10.5)
WBC: 6.3 10*3/uL (ref 4.0–10.5)

## 2010-12-18 LAB — CARDIAC PANEL(CRET KIN+CKTOT+MB+TROPI)
CK, MB: 2.3 ng/mL (ref 0.3–4.0)
Relative Index: INVALID (ref 0.0–2.5)
Relative Index: INVALID (ref 0.0–2.5)
Total CK: 63 U/L (ref 7–177)
Troponin I: 0.74 ng/mL (ref 0.00–0.06)

## 2010-12-18 LAB — HEMOGLOBIN A1C
Hgb A1c MFr Bld: 6.3 % — ABNORMAL HIGH (ref 4.6–6.1)
Mean Plasma Glucose: 134 mg/dL

## 2010-12-18 LAB — URINALYSIS, ROUTINE W REFLEX MICROSCOPIC
Glucose, UA: NEGATIVE mg/dL
Ketones, ur: NEGATIVE mg/dL
pH: 7 (ref 5.0–8.0)

## 2010-12-18 LAB — COMPREHENSIVE METABOLIC PANEL
ALT: 30 U/L (ref 0–35)
AST: 27 U/L (ref 0–37)
CO2: 30 mEq/L (ref 19–32)
Chloride: 104 mEq/L (ref 96–112)
GFR calc Af Amer: 60 mL/min — ABNORMAL LOW (ref >60)
GFR calc non Af Amer: 49 mL/min — ABNORMAL LOW (ref >60)
Potassium: 4.1 mEq/L (ref 3.5–5.1)
Sodium: 139 mEq/L (ref 135–145)
Total Bilirubin: 0.6 mg/dL (ref 0.3–1.2)

## 2010-12-18 LAB — URINE CULTURE: Colony Count: 30000

## 2010-12-18 LAB — HEPARIN LEVEL (UNFRACTIONATED): Heparin Unfractionated: 0.85 IU/mL — ABNORMAL HIGH (ref 0.30–0.70)

## 2010-12-18 LAB — POCT CARDIAC MARKERS

## 2010-12-18 LAB — DIFFERENTIAL
Basophils Absolute: 0.3 10*3/uL — ABNORMAL HIGH (ref 0.0–0.1)
Eosinophils Absolute: 0.5 10*3/uL (ref 0.0–0.7)
Eosinophils Relative: 7 % — ABNORMAL HIGH (ref 0–5)
Monocytes Absolute: 0.6 10*3/uL (ref 0.1–1.0)

## 2010-12-18 LAB — TROPONIN I: Troponin I: 0.77 ng/mL (ref 0.00–0.06)

## 2011-01-16 IMAGING — CR DG CHEST 2V
2 series · 2 of 2 positions shown · non-contrast
Comparison: 09/13/2009 and earlier.

CLINICAL DATA: 89-year-old female with congestive heart failure.

CHEST - 2 VIEW

[view not recorded (1 of 2)]
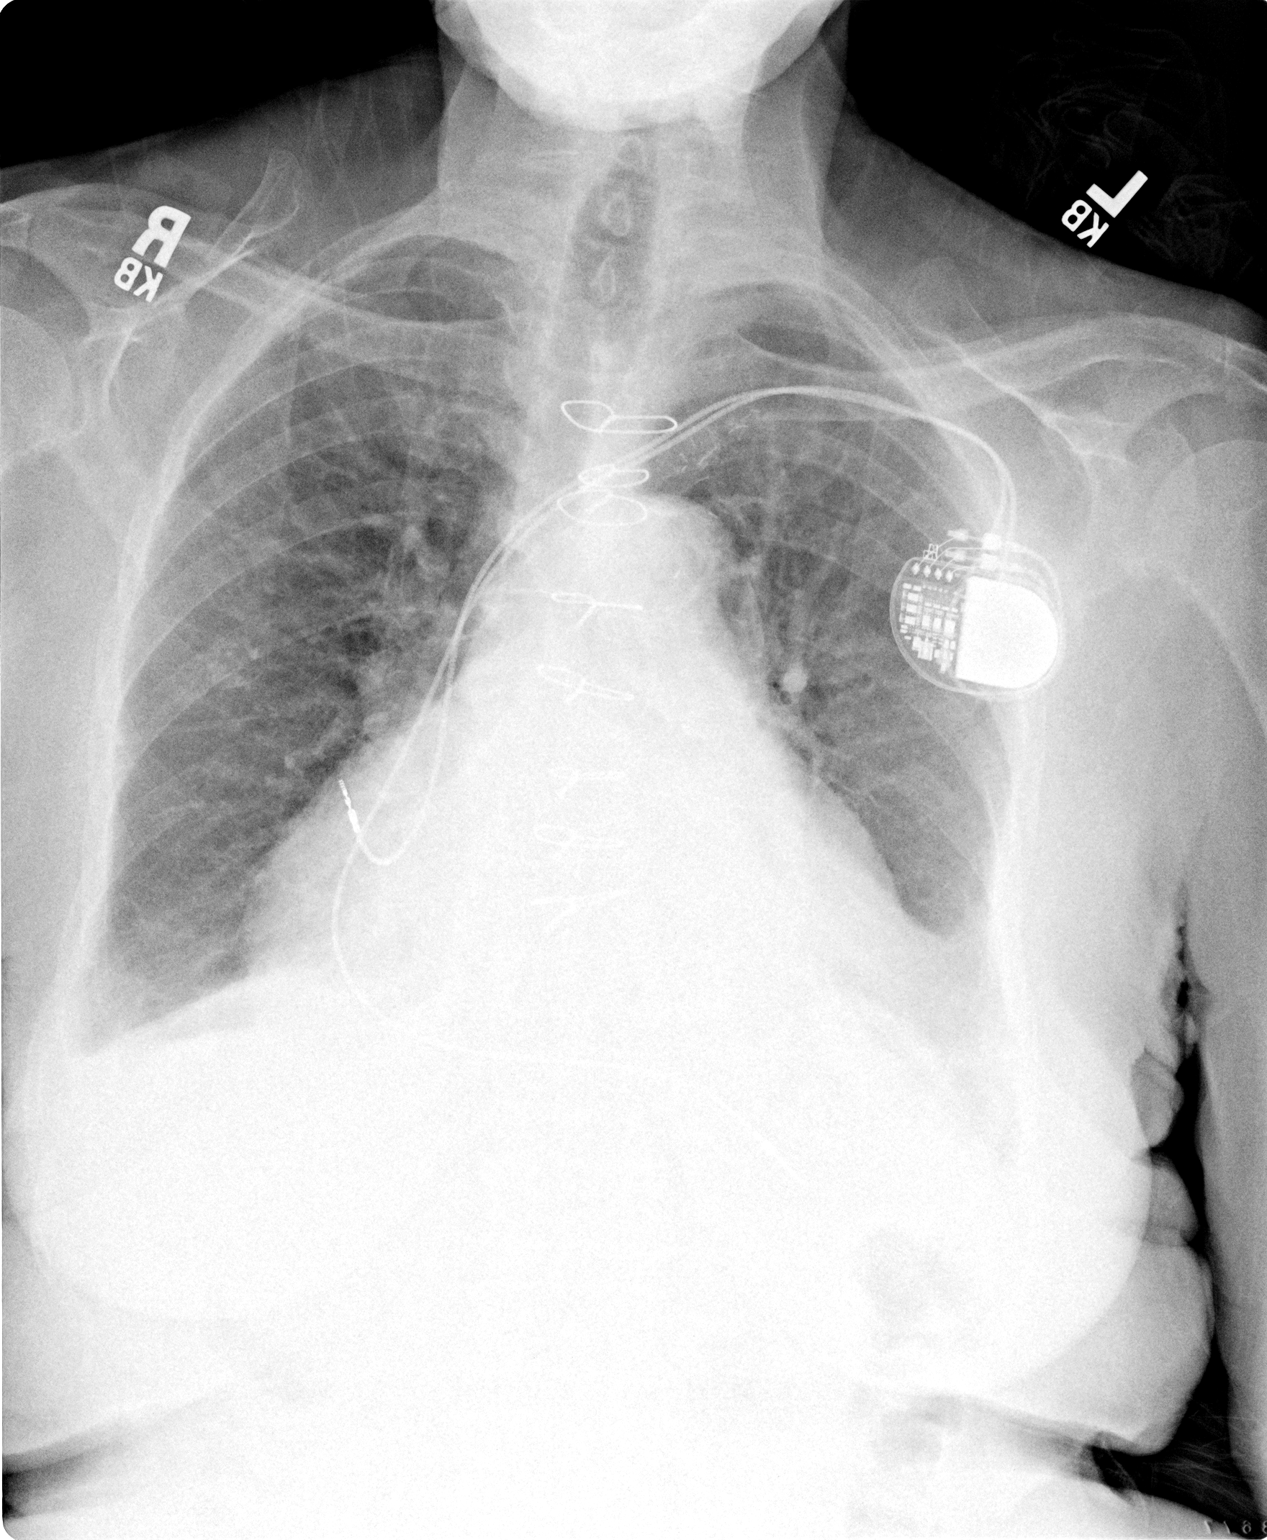

[view not recorded (2 of 2)]
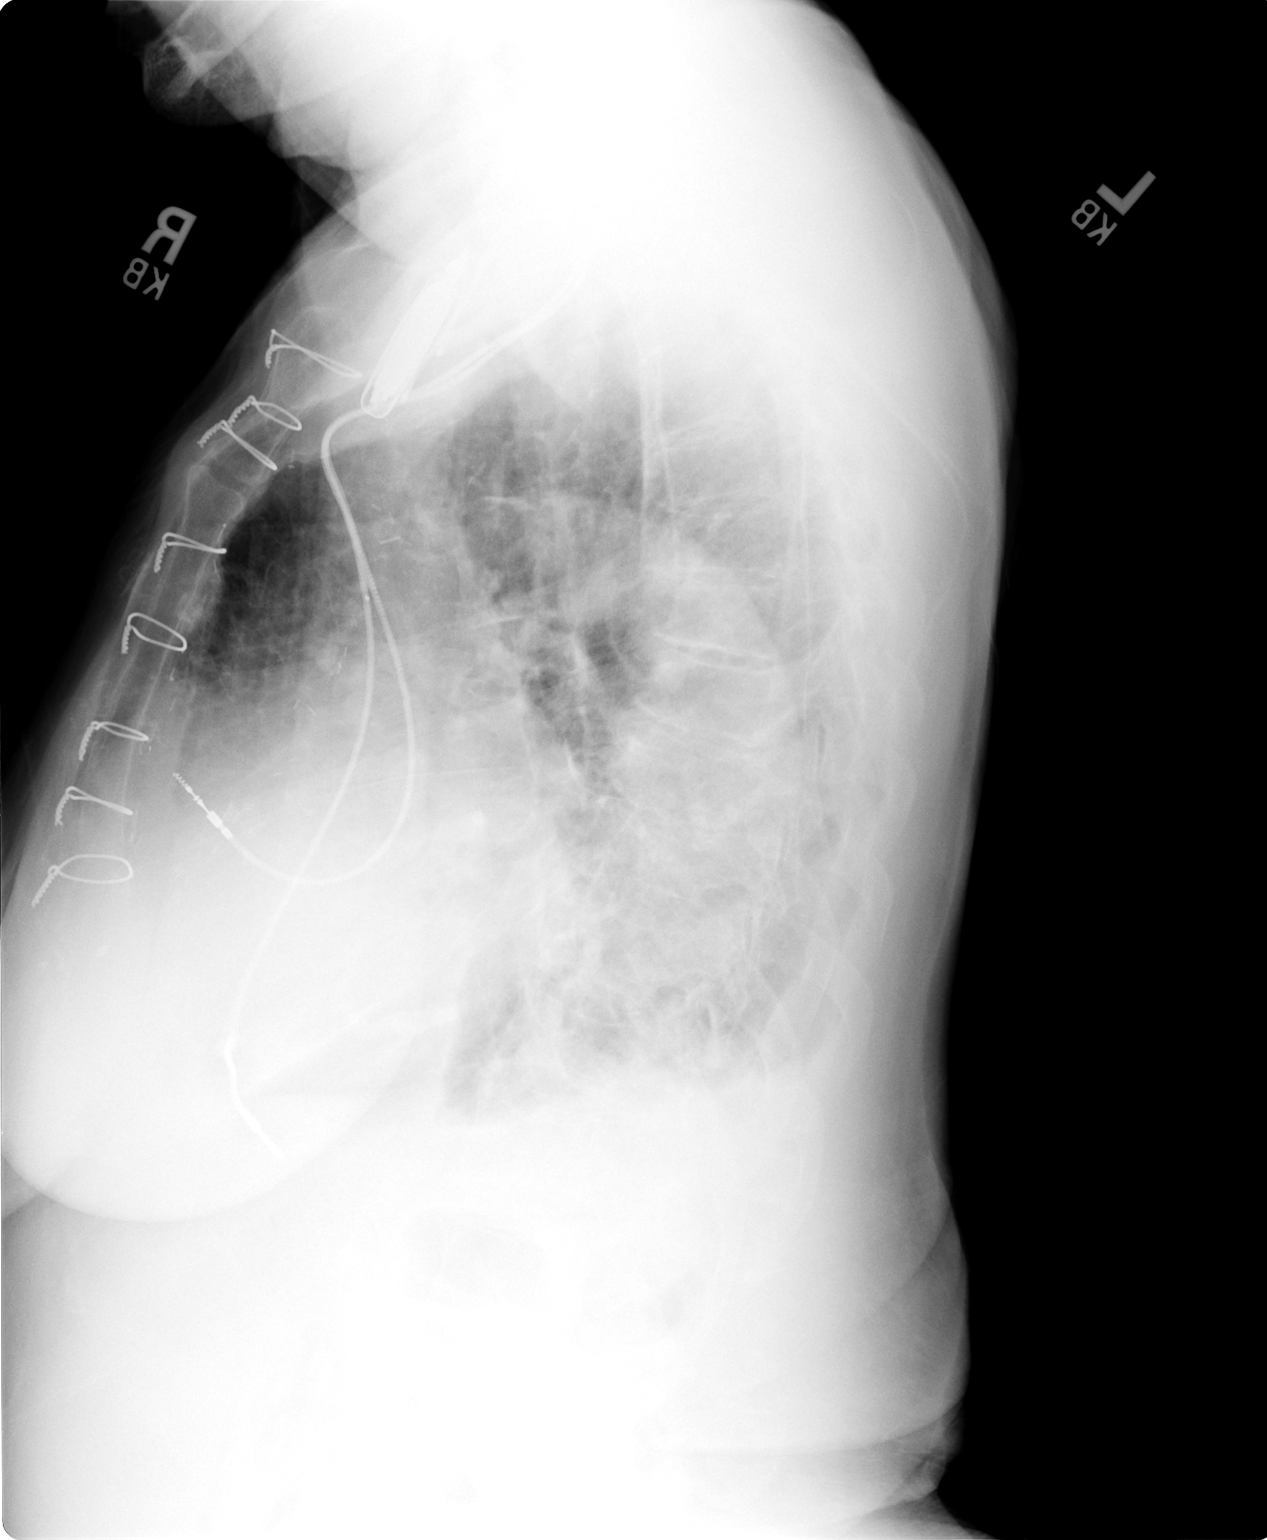

[2 of 2 positions shown; findings below may reference images not displayed]

FINDINGS: Left chest cardiac pacemaker.  Stable marked cardiomegaly
and mediastinal contours.  Left greater than right pleural
effusions, moderate on the left are not significantly changed.
Pulmonary vascular congestion is unchanged.  No pneumothorax or new
airspace disease.
IMPRESSION: Stable left greater than right pleural effusions and pulmonary
vascular congestion without overt pulmonary edema.  Marked
cardiomegaly.

## 2011-01-23 NOTE — Consult Note (Signed)
NAMEPANG, ROBERS             ACCOUNT NO.:  [id number]   MEDICAL RECORD NO.:  [id number]          PATIENT TYPE:  INP   LOCATION:  1436                         FACILITY:  [facility]   PHYSICIAN:  Doylene Canning. Ladona Ridgel, MD    DATE OF BIRTH:  02-Sep-1919   DATE OF CONSULTATION:  02/23/2009  DATE OF DISCHARGE:                                 CONSULTATION   Ms. Ard is referred today by [facility] for evaluation of  congestive heart failure and borderline elevated cardiac enzymes.   HISTORY OF PRESENT ILLNESS:  The patient is a very pleasant 77 year old  woman who resides in [place], [place].  She has recently moved  up to [facility] to be closer to her daughter.  This patient has a  history of coronary artery disease and is status post bypass surgery and  underwent recent stenting, approximately, 1 month ago.  At that time,  she had presented with heart failure symptoms.  She has a well-preserved  left ventricular systolic function.  The patient has not had any anginal  symptoms but over the last several days has noted increasingly frequent  episodes of shortness of breath, PND, and orthopnea.  She also notes  worsening peripheral edema.  She admits to dietary indiscretion though  she denies medical noncompliance.  She is admitted to the hospital and  she was found to have mild elevation of her troponin.  The patient had  normal CK and MBs.  Her EKG demonstrated no acute abnormalities, though  she does have fairly marked LVH.  The patient denies anginal symptoms.   PAST MEDICAL HISTORY:  Also notable for type 2 diabetes, dyslipidemia,  and hypertension.   FAMILY HISTORY:  Noncontributory at her advanced age.   SOCIAL HISTORY:  The patient lives with her daughter.  She denies  tobacco or ethanol abuse.   REVIEW OF SYSTEMS:  Negative except as noted in HPI.  All systems are  reviewed and negative except as noted above.   PHYSICAL EXAMINATION:  GENERAL:  She is a  pleasant, elderly-appearing  woman in no acute distress.  VITAL SIGNS:  The initial blood pressure was 143/50, pulse 60 and  regular, respirations were 20, and temperature 98.  HEENT:  Normocephalic and atraumatic.  Pupils are equal and round.  Oropharynx is moist.  Sclerae anicteric.  NECK:  A 7-cm jugular venous distention.  There is no thyromegaly.  Trachea is midline.  Carotids 2+ and symmetric.  LUNGS:  Rales in the bases bilaterally with no wheezes or rhonchi.  There is no increased work of breathing.  CARDIOVASCULAR:  Regular rate and rhythm.  Normal S1 and S2.  There is a  soft S4 gallop present.  The PMI was not enlarged nor laterally  displaced.  ABDOMEN:  Soft and nontender.  There is no organomegaly.  Bowel sounds  present.  No rebound or guarding was noted.  EXTREMITIES:  Demonstrated  1+ peripheral edema bilaterally.  It is somewhat woody appearing.  NEUROLOGIC:  Alert and oriented x3.  Cranial nerves are intact.  Strength is 5/5 and symmetric.   IMPRESSION:  1. Acute on chronic diastolic heart failure.  The patient had been      diuresed 1 liter of fluid.  Recommendations at this time will be to      continue IV Lasix until we see some slight elevation of her      creatinine.  In long term, I would not put her back on      hydrochlorothiazide but would recommend oral Lasix with potassium      supplementation as needed.  2. Known coronary disease with borderline cardiac troponins.  On      discussion, the patient has no anginal symptoms and unless her      troponins increase markedly, I would not invasively workup her      coronary status at the present time, rather, I would prefer a      period of watchful waiting.  3. Hypertension.  Should continue current medications with adjustments      made as needed.  4. Dyslipidemia.  5. Diabetes.   DISCUSSION:  We will continue as noted her current treatment.  We will  make sure she is to follow up in our Cardiology office.   Additional  recommendation is based on how she does.  I would anticipate discharge  in the next 24 to 48 hours.  Because her troponins are slightly  elevated, I would continue heparin for 24 more hours.      Doylene Canning. Ladona Ridgel, MD  Electronically Signed     GWT/MEDQ  D:  02/23/2009  T:  02/24/2009  Job:  [phone] [id number]

## 2011-01-23 NOTE — H&P (Signed)
Taylor Marquez, Taylor Marquez             ACCOUNT NO.:  [id number]   MEDICAL RECORD NO.:  [id number]          PATIENT TYPE:  EMS   LOCATION:  ED                           FACILITY:  [facility]   PHYSICIAN:  Zannie Cove, MD     DATE OF BIRTH:  Jan 11, 1919   DATE OF ADMISSION:  02/22/2009  DATE OF DISCHARGE:                              HISTORY & PHYSICAL   PRIMARY CARE PHYSICIAN:  The patient is unassigned.  Dr. Wende Bushy in [place].   CARDIOLOGIST:  Dr. Trisha Mangle in [place] as well.   CHIEF COMPLAINT:  Shortness of breath.   HISTORY OF PRESENT ILLNESS:  Ms. Taylor Marquez is an 77 year old African-  American female with a history of CAD, CABG, and recent non STEMI in  [facility] in [place] and was treated medically.  Is  here with complaints of shortness of breath which is chronic for 2 to 3  months but worse in the last week.  She is currently dyspneic at rest.  Also reports PND and orthopnea.  She also reports weight gain,, about 15  to 20 pounds in the last several weeks and increased swelling of her  feet.  She denies any chest pain now, but she reports that she has had  left-sided chest discomfort on and off for months.  She denies any  palpitations.  She denies fevers, chills, cough, etc.  Of note, during  her admission in [place] for the non STEMI, she had a cardiac  catheterization which showed an 80% occlusion of the LAD and 50%  circumflex.  However, the LIMA to LAD graft was patent and was probably  treated medically.   PAST MEDICAL HISTORY:  Significant for:  1. Coronary disease.  2. Pulmonary hypertension.  3. Congestive heart failure.  4. Ejection fraction of 45% per catheterization  in May and mild      diastolic dysfunction.  5. Diabetes.  6. Dyslipidemia.  7. CVA.   PAST SURGICAL HISTORY:  1. CABG.  2. Total hip arthroplasty.  3. Aneurysm repair.   MEDICATIONS:  1. Aspirin 81 mg once a day.  2. Hydrochlorothiazide 12.5 once a day.  3.  Lisinopril 5 once a day.  4. Metoprolol 50 b.i.d.  5. Plavix 75 q d.  6. Simvastatin 20 q d.  7. Niacin 500 at bedtime.  8. Lantus 10 units subcu at bedtime.  9. Hydrocodone APAP 1 to 2 tabs q 6 p.r.n.   ALLERGIES:  NO KNOWN DRUG ALLERGIES.   SOCIAL HISTORY:  She used to live in [place] up until 3 days ago.  Has just moved to [place] and lives here now to be closer to her  daughters.  Denies any smoking or alcohol.   FAMILY HISTORY:  No disease.   REVIEW OF SYSTEMS:  A 12-system review is negative except for HPI.   EXAM:  Temperature is 97.4, pulse is 65, blood pressure 164/76,  respirations are 20, saturation 99% on 2 liters.  GENERAL EXAM:  She is awake, alert, and oriented x3.  HEENT:  Pupils equal, reactive to light, extraocular movements intact.  JVD is about 8 cm.  CARDIOVASCULAR SYSTEM:  S1, s2, regular rate and rhythm, no S3  appreciated.  LUNGS:  With bibasilar crackles.  ABDOMEN:  Soft, nontender with positive bowel sounds.  EXTREMITIES:  With 2+ edema.   LABS:  CBC:  White count of 6.3, hemoglobin 12.5, platelets 192.  Chemistry:  Sodium 139, potassium 4.1, chloride 104, bicarb 30, BUN 14,  creatinine 1.0, glucose 126.  Liver panel is normal except for a BUN of  2.3, calcium 9.9, CK-MB less than 1, troponin less than 0.05.  BNP  elevated at 1260.  EKG is sinus, rate of 65, and T-wave inversion in V5  and V6.  We currently have no prior EKGs for comparison, however, H and  P and discharge summary from mid May also note flipped T-waves in the  lateral leads.  Chest x-ray:  Cardiac enlargement with vascular  congestion, U-waves normal.   ASSESSMENT/PLAN:  An 77 year old female with:  1. Congestive heart failure exacerbation likely systolic heart      failure.  Will start IV Lasix.  Already received a dose in the ED.      Will give another 40 mg now and 20 IV b.i.d. from tomorrow.      Supplement with oxygen.  Monitor on telemetry.  Give supplemental       potassium.  Repeat potassium level at night.  Continue metoprolol,      Lisinopril and Simvastatin.  Will also check a 2-D echo and get 2      more sets of cardiac enzymes and EKG in the morning.  Follow her      ins and outs.  Follow daily weights.  In addition, get CHF      education and heart healthy low-sodium diet.  2. Diabetes type 2.  Decrease Lantus to just 5 units q.h.s., ADA diet      and sliding-scale insulin.  3. Coronary disease.  Continue aspirin, Plavix, metoprolol,      Simvastatin, Lisinopril.  4. Pulmonary hypertension.  Will check a 2-D echo to determine PA      pressure.  Will continue supplemental O2 for now.  5. Deep venous thrombosis prophylaxis with Lovenox 40 mg subcu daily.  6. Code status:  The patient is a full code.  7. Disposition:  The patient will possibly be discharged in a couple      of days, however will need new primary care physician as well as      new cardiologist __________ she has moved permanently from [place] now.      Zannie Cove, MD  Electronically Signed    PJ/MEDQ  D:  02/22/2009  T:  02/22/2009  Job:  [id number]

## 2011-01-23 NOTE — Discharge Summary (Signed)
NAMEKEA, CALLAN             ACCOUNT NO.:  [id number]   MEDICAL RECORD NO.:  [id number]          PATIENT TYPE:  INP   LOCATION:  1436                         FACILITY:  [facility]   PHYSICIAN:  Hillery Aldo, M.D.   DATE OF BIRTH:  06-08-1919   DATE OF ADMISSION:  02/22/2009  DATE OF DISCHARGE:  02/25/2009                               DISCHARGE SUMMARY   PRIMARY CARE PHYSICIAN:  Unassigned.   CARDIOLOGIST:  Dr. Gala Romney.   DISCHARGE DIAGNOSES:  1. Acute on chronic diastolic congestive heart failure.  2. History of coronary artery disease.  3. Elevated troponin secondary to congestive heart failure      exacerbation.  4. Hypertension.  5. Acute renal failure secondary to over diuresis.  6. Dyslipidemia.  7. Diabetes mellitus.  8. Cerebrovascular disease.  9. Mild normocytic anemia.   DISCHARGE MEDICATIONS:  1. Lasix 20 mg p.o. daily.  2. Potassium chloride 20 mEq p.o. daily.  3. Aspirin 81 mg p.o. daily.  4. Lisinopril 5 mg p.o. daily.  5. Metoprolol 50 mg p.o. b.i.d.  6. Plavix 75 mg p.o. daily.  7. Simvastatin 20 mg p.o. daily.  8. Niacin 500 mg p.o. q.h.s.  9. Lantus insulin 10 units subcutaneously q.h.s.  10.Hydrocodone/APAP 1 to 2 tabs p.o. every 6 hours p.r.n. pain.   CONSULTATIONS:  Dr. Ladona Ridgel and Dr. Gala Romney of Cardiology.   BRIEF ADMISSION HPI:  Patient is a very pleasant 77 year old female who  recently suffered with a non-ST elevation MI.  She has a history of  coronary artery disease and CABG in the past and her most recent MI was  treated medically at [facility] in [place].  Patient is  originally from [place].  She is staying with her daughter  locally and presented to the hospital with worsening dyspnea.  Upon  initial evaluation in the emergency department, the patient was found to  be in congestive heart failure and subsequently was referred to the  hospitalist service for admission.  For the full details, please see the  dictated report done by Dr. Jomarie Longs.   PROCEDURES AND DIAGNOSTIC STUDIES.:  1. Chest x-ray on February 22, 2009, showed cardiac enlargement with      vascular congestion.  2. Chest x-ray on February 23, 2009, status post placement of a PICC      catheter showed the catheter in the superior vena cava.  3. A 2D echocardiogram on February 23, 2009, showed moderate concentric      hypertrophy of the left ventricle with vigorous systolic function      and ejection fraction estimated at 65% to 70%.  There was no      regional wall motion abnormality.  Doppler parameters were      consistent with restrictive physiology and diastolic dysfunction.      There was mild aortic valve regurgitation.  There was moderate      calcified annulus of the mitral valve.  The left atrium was      severely dilated.  The right ventricle wall thickness was mild to      moderately increased.  Right atrium  was severely dilated.  There      was mild to moderate regurgitation of the tricuspid valve.      Systolic pressure in the pulmonary arteries was moderately      increased with a PA peak pressure of 57 mmHg.   DISCHARGE LABORATORY VALUES:  Sodium was 135, potassium 3.8, chloride  97, bicarb 31, BUN 30, creatinine 1.72, glucose 146, calcium 9.3.  BNP  was 289.  Troponin-I 0.77.   HOSPITAL COURSE BY PROBLEM:  1. Acute on chronic diastolic congestive heart failure:  Patient was      admitted and put on aggressive diuresis.  Because of her past      medical history of a recent non-ST elevation MI and elevated      troponins, Cardiology consultation was requested and kindly      provided by Dr. Ladona Ridgel and Dr. Gala Romney.  Patient was initially      put on heparin while she was being ruled out for recurrent MI.      Patient did not have any complaints of chest pain and her troponins      were felt to be borderline and ultimately discontinued on February 24, 2009.  Patient had approximate 5-kg weight loss with diuresis       through the course of her hospital stay.  At this point, she is      likely mildly overly diuresed and our plan is to hold Lasix today      and discharge her on 20 mg daily.  She will follow up at the CHF      clinic next week.  2. Coronary artery disease/increased troponins:  Patient has a known      history of coronary artery disease.  She was maintained on aspirin      and Plavix in addition to 48 hours of heparin.  Patient did not      have any EKG changes to suggest acute infarction.  She did not have      any complaints of chest pain.  Ultimately, it was felt that her      elevated troponins were from congestive heart failure and decreased      renal clearance.  3. Hypertension:  Patient's blood pressure is well controlled on the      regimen as outlined above.  4. Acute renal failure.  Patient did experience a creatinine bump from      1.27 to 1.72.  This was felt to be secondary to over diuresis.  Her      Lasix is being held and she will resume Lasix on February 26, 2009, at      20 mg daily.  5. Dyslipidemia:  Patient was maintained on her usual statin therapy.  6. Diabetes:  Patient has excellent outpatient control as evidenced by      her hemoglobin A1c of 6.3%.  She will continue on Lantus nightly at      discharge.  7. Cerebrovascular disease:  Patient was maintained on antiplatelet      therapy and had good control of her risk factors while in the      hospital.  8. Mild normocytic anemia:  Patient's hemoglobin and hematocrit have      remained stable throughout her hospital stay.   DISPOSITION:  Patient is medically stable and will be discharged home.  We will obtain a PT and OT evaluation for evaluation of the need for  durable medical  equipment prior to discharge.  Her primary care  physician is in [place]; however, she will follow up with the CHF  clinic here next week.   TIME SPENT COORDINATING CARE FOR DISCHARGE AND DISCHARGE INSTRUCTIONS:  Equals 35  minutes.      Hillery Aldo, M.D.  Electronically Signed     CR/MEDQ  D:  02/25/2009  T:  02/25/2009  Job:  [id number]   cc:   Bevelyn Buckles. Bensimhon, MD  [address], [place] [address]

## 2011-02-05 ENCOUNTER — Emergency Department (HOSPITAL_COMMUNITY)

## 2011-02-05 ENCOUNTER — Inpatient Hospital Stay (HOSPITAL_COMMUNITY)
Admission: EM | Admit: 2011-02-05 | Discharge: 2011-02-07 | DRG: 293 | Disposition: A | Discharge status: Hospice | Attending: Internal Medicine | Admitting: Internal Medicine

## 2011-02-05 DIAGNOSIS — E785 Hyperlipidemia, unspecified: Secondary | ICD-10-CM | POA: Diagnosis present

## 2011-02-05 DIAGNOSIS — E119 Type 2 diabetes mellitus without complications: Secondary | ICD-10-CM | POA: Diagnosis present

## 2011-02-05 DIAGNOSIS — I5032 Chronic diastolic (congestive) heart failure: Principal | ICD-10-CM | POA: Diagnosis present

## 2011-02-05 DIAGNOSIS — I2510 Atherosclerotic heart disease of native coronary artery without angina pectoris: Secondary | ICD-10-CM | POA: Diagnosis present

## 2011-02-05 DIAGNOSIS — I129 Hypertensive chronic kidney disease with stage 1 through stage 4 chronic kidney disease, or unspecified chronic kidney disease: Secondary | ICD-10-CM | POA: Diagnosis present

## 2011-02-05 DIAGNOSIS — Z515 Encounter for palliative care: Secondary | ICD-10-CM

## 2011-02-05 DIAGNOSIS — N189 Chronic kidney disease, unspecified: Secondary | ICD-10-CM | POA: Diagnosis present

## 2011-02-05 DIAGNOSIS — E876 Hypokalemia: Secondary | ICD-10-CM | POA: Diagnosis present

## 2011-02-05 DIAGNOSIS — Z951 Presence of aortocoronary bypass graft: Secondary | ICD-10-CM

## 2011-02-05 DIAGNOSIS — Z950 Presence of cardiac pacemaker: Secondary | ICD-10-CM

## 2011-02-05 DIAGNOSIS — Z96649 Presence of unspecified artificial hip joint: Secondary | ICD-10-CM

## 2011-02-05 DIAGNOSIS — R5381 Other malaise: Secondary | ICD-10-CM | POA: Diagnosis present

## 2011-02-05 DIAGNOSIS — I509 Heart failure, unspecified: Secondary | ICD-10-CM | POA: Diagnosis present

## 2011-02-05 LAB — BASIC METABOLIC PANEL
CO2: 39 mEq/L — ABNORMAL HIGH (ref 19–32)
Chloride: 93 mEq/L — ABNORMAL LOW (ref 96–112)
GFR calc Af Amer: 36 mL/min — ABNORMAL LOW (ref >60)
Glucose, Bld: 101 mg/dL — ABNORMAL HIGH (ref 70–99)
Potassium: 3.3 mEq/L — ABNORMAL LOW (ref 3.5–5.1)
Sodium: 138 mEq/L (ref 135–145)

## 2011-02-05 LAB — CBC
HCT: 35.9 % — ABNORMAL LOW (ref 36.0–46.0)
Hemoglobin: 12.2 g/dL (ref 12.0–15.0)
MCV: 87.6 fL (ref 78.0–100.0)
RDW: 15.3 % (ref 11.5–15.5)
WBC: 7.2 10*3/uL (ref 4.0–10.5)

## 2011-02-05 LAB — DIFFERENTIAL
Eosinophils Relative: 3 % (ref 0–5)
Lymphocytes Relative: 41 % (ref 12–46)
Lymphs Abs: 2.9 10*3/uL (ref 0.7–4.0)
Neutro Abs: 3.3 10*3/uL (ref 1.7–7.7)

## 2011-02-05 LAB — URINALYSIS, ROUTINE W REFLEX MICROSCOPIC
Bilirubin Urine: NEGATIVE
Glucose, UA: NEGATIVE mg/dL
Hgb urine dipstick: NEGATIVE
Specific Gravity, Urine: 1.009 (ref 1.005–1.030)
pH: 6.5 (ref 5.0–8.0)

## 2011-02-05 LAB — PRO B NATRIURETIC PEPTIDE: Pro B Natriuretic peptide (BNP): 19563 pg/mL — ABNORMAL HIGH (ref 0–450)

## 2011-02-06 LAB — BASIC METABOLIC PANEL
CO2: 41 mEq/L (ref 19–32)
Chloride: 96 mEq/L (ref 96–112)
Creatinine, Ser: 1.6 mg/dL — ABNORMAL HIGH (ref 0.4–1.2)
GFR calc Af Amer: 37 mL/min — ABNORMAL LOW (ref >60)
Sodium: 140 mEq/L (ref 135–145)

## 2011-02-06 NOTE — H&P (Signed)
Taylor Marquez, Taylor Marquez             ACCOUNT NO.:  [id number]  MEDICAL RECORD NO.:  [id number]           PATIENT TYPE:  I  LOCATION:  1327                         FACILITY:  [facility]  PHYSICIAN:  Michiel Cowboy, MDDATE OF BIRTH:  1919-08-24  DATE OF ADMISSION:  02/05/2011 DATE OF DISCHARGE:                             HISTORY & PHYSICAL   PRIMARY CARE PROVIDER:  Hospice.  They are not sure about the primary care doctor's name.  CHIEF COMPLAINT:  The patient does not feel well and has a foreboding.  The patient is a 77 year old female with significant history of severe CHF, diabetes which is currently only diet controlled, and renal insufficiency.  The patient for the past few days has not been feeling well.  She cannot quite tell me what is that she means by that. She is always short of breath and that has been without significant change. She can barely make a few steps without getting short of breath, but saturating currently 100% on room air.  She is not wheezing.  She is not coughing.  She does not describe any pain or discomfort.  She is not nauseous, but she states that she feels overall sick, weak, and unwell and would like to be hospitalized.  Otherwise, review of systems is negative, 10 systems were reviewed.  PAST MEDICAL HISTORY: 1. Hypertension. 2. CHF. 3. Diabetes mellitus. 4. Coronary artery disease. 5. Status post pacemaker. 6. The patient is on hospice for CHF. 7. History of renal insufficiency. 8. Status post total hip replacement. 9. History of bypass surgery. 10.Dyslipidemia. 11.History of CVA. 12.History of aneurysm repair. 13.History of chronic bilateral pleural effusions. 14.History of decubitus ulcers.  SOCIAL HISTORY:  The patient is a former smoker, but quit many years ago.  Does not drink or abuse drugs.  FAMILY HISTORY:  Noncontributory.  ALLERGIES:  None.  MEDICATIONS: 1. Aspirin 81 mg daily. 2. Niacin 500 mg daily. 3. Metoprolol 50  mg b.i.d. 4. Lasix 40 mg b.i.d. 5. Metolazone 5 mg in the morning.  PHYSICAL EXAMINATION:  VITAL SIGNS:  Temperature 98.1, blood pressure 131/76, pulse 61, respirations 20, saturating 94% up to 100% on room air. GENERAL:  The patient appears to be in no acute distress. HEENT:  Head, nontraumatic.  Moist mucous membranes. LUNGS:  Somewhat diminished at the bases, but otherwise unremarkable. HEART:  Regular rate and rhythm.  No murmurs appreciated. ABDOMEN:  Soft, nontender, nondistended. LOWER EXTREMITIES:  They have about 2+ edema, which is chronic and unchanged.  There are some bandages on the lower extremities and her family states that sometimes there is some weeping. NEUROLOGIC:  Grossly intact.  Alert and oriented.  LABORATORY DATA:  White blood cell count 7.2, hemoglobin 12.2, sodium 138, potassium 3.3, creatinine 1.62, calcium 9.6, BNP 19,000.  UA unremarkable.  Chest x-ray showing bilateral effusions, but otherwise unchanged from prior.  ASSESSMENT AND PLAN: 1. Congestive heart failure.  The patient currently appears to be at     her baseline.  She does have chronic peripheral edema.  We will     continue for now her Lasix and metolazone.  She is not symptomatic  from pulmonary standpoint.  I would avoid over diuresis and for now     would observe her and monitor her overnight.  We will hold off on     IV fluids. 2. Hypokalemia.  We will replace gently. 3. Diabetes mellitus.  At this point, they are no longer checking her     blood sugars.  We will just write for diabetic diet. 4. Renal insufficiency.  Her creatinine is currently at her baseline. 5. Prophylaxis.  P.o. intake and Lovenox.  CODE STATUS:  The patient is do not resuscitate, do not intubate.  She is on hospice and only interested in comfort measures, although would be interested in treating her infection if anything develops.  We will observe overnight and treat pain if the patient complains of any.   I expect for her to be able to be discharged to home.   I have spent 55 min on this admittion.     Michiel Cowboy, MD     AVD/MEDQ  D:  02/05/2011  T:  02/05/2011  Job:  [id number]  Electronically Signed by Therisa Doyne MD on 02/06/2011 06:24:53 AM

## 2011-02-13 NOTE — Discharge Summary (Signed)
Taylor Marquez, Taylor Marquez             ACCOUNT NO.:  [id number]  MEDICAL RECORD NO.:  [id number]           PATIENT TYPE:  I  LOCATION:  1327                         [facility]  PHYSICIAN:  Erick Blinks, MD     DATE OF BIRTH:  1919-06-25  DATE OF ADMISSION:  02/05/2011 DATE OF DISCHARGE:                        DISCHARGE SUMMARY - REFERRING   PRIMARY CARE PHYSICIAN:  Dr. Tommye Standard  DISCHARGE DIAGNOSES: 1. General malaise, resolved. 2. Chronic diastolic congestive heart failure, stable. 3. Diabetes mellitus. 4. Coronary artery disease. 5. Status post pacemaker. 6. History of renal insufficiency. 7. Status post total hip replacement. 8. History of coronary artery bypass graft. 9. Dyslipidemia. 10.History of cerebrovascular accident. 11.Chronic bilateral pleural effusions. 12.History of decubitus ulcers.  DISCHARGE MEDICATIONS: 1. Tylenol 500 mg 1 tablet p.o. q.8 h. p.r.n. 2. Aspirin 81 mg p.o. daily. 3. Niacin 500 mg p.o. nightly. 4. Potassium chloride 10 mEq 1 tablet p.o. daily. 5. Metolazone 2.5 mg 1 tablet p.o. q.a.m. 6. Morphine sulfate oral solution 100 mg/5 mL, 0.25 to 1 mL p.o. q.2     h. p.r.n. 7. Remeron 15 mg half tablet p.o. nightly. 8. Lasix 40 mg p.o. b.i.d. 9. Metoprolol 50 mg p.o. b.i.d.  ADMISSION HISTORY:  This is a 77 year old African American female who is followed by hospice at home for diastolic congestive heart failure.  The patient is on comfort measures.  She is followed by home hospice. According to the admission H and P, for the past few days prior to admission she was not feeling well.  Per the family, she was then noted to be somewhat weaker.  The patient does report that she may have noticed some increasing shortness of breath and due to generally not feeling well, the patient demanded to be brought to the hospital for admission, it does not appear that she was any more short of breath and normal. On presentation to the hospital, she was  saturating 100% on room air.  The patient was admitted for further evaluation.  For further details, please refer to the history and physical dictated by Dr. Adela Glimpse on May 28th.  HOSPITAL COURSE:  The patient was monitored here in the hospital, essentially no medication changes were made.  The patient has been on appropriate doses of diuretics.  She also has oxygen at home.  She is currently breathing comfortably on room air.  They did not feel that any further workup is necessary for congestive heart failure, especially in light of her hospice/comfort care status.  We will continue the current medication that she is on and plan on discharging her home.  The remainder of her medical issues have remained stable.  I have attempted to educate the family on communicating with hospice prior to coming to the hospital.  We strongly advised that the hospice team have continued conversations and education with the family and to determine what would be an indication to come back to the hospital. Also further end-of-life care will need to be discussed for this patient including minimizing her medication regimen and focusing completely on comfort.  At this time, the patient is comfortable to return home with  hospice care and the family is agreeable for this as well.  We will plan on discharging the patient home in the morning.  CONSULTATIONS:  None.  DIAGNOSTIC IMAGING:  Chest x-ray on admission shows bilateral effusions and basilar atelectasis negative for edema.  CONDITION AT TIME OF DISCHARGE:  Improved.  DISCHARGE INSTRUCTIONS:  The patient should continue on a heart-healthy diet or regular diet for comfort.  She can follow up with her primary care physician as needed.  She will continue to be followed by hospice and should have continued discussions regarding end-of-life care and goals of care as an outpatient.  Currently, she appears stable for discharge and we will plan for discharge  in the morning.     Erick Blinks, MD     JM/MEDQ  D:  02/06/2011  T:  02/06/2011  Job:  [id number]  cc:   Dr. Tommye Standard  Electronically Signed by Durward Mallard MEMON  on 02/13/2011 04:25:25 PM

## 2011-05-16 ENCOUNTER — Encounter: Payer: Self-pay | Admitting: *Deleted

## 2011-09-26 ENCOUNTER — Encounter: Payer: Self-pay | Admitting: Cardiology

## 2011-10-23 ENCOUNTER — Telehealth: Payer: Self-pay | Admitting: Cardiology

## 2011-10-23 NOTE — Telephone Encounter (Signed)
09-26-11 sent past due letter/mt 10-23-11 lmm @ 1026AM for pt to call and set up pacer ck/mt

## 2011-11-01 ENCOUNTER — Telehealth: Payer: Self-pay | Admitting: Cardiology

## 2011-11-01 NOTE — Telephone Encounter (Signed)
11-01-11 pt's dtr calling re pt in hospice and will not be coming in for checks for a while/mt

## 2012-04-19 IMAGING — CR DG CHEST 2V
2 series · 2 of 2 positions shown · non-contrast
Comparison: 11/03/2009

CLINICAL DATA: Chest pain

CHEST - 2 VIEW

[w chest pa *]
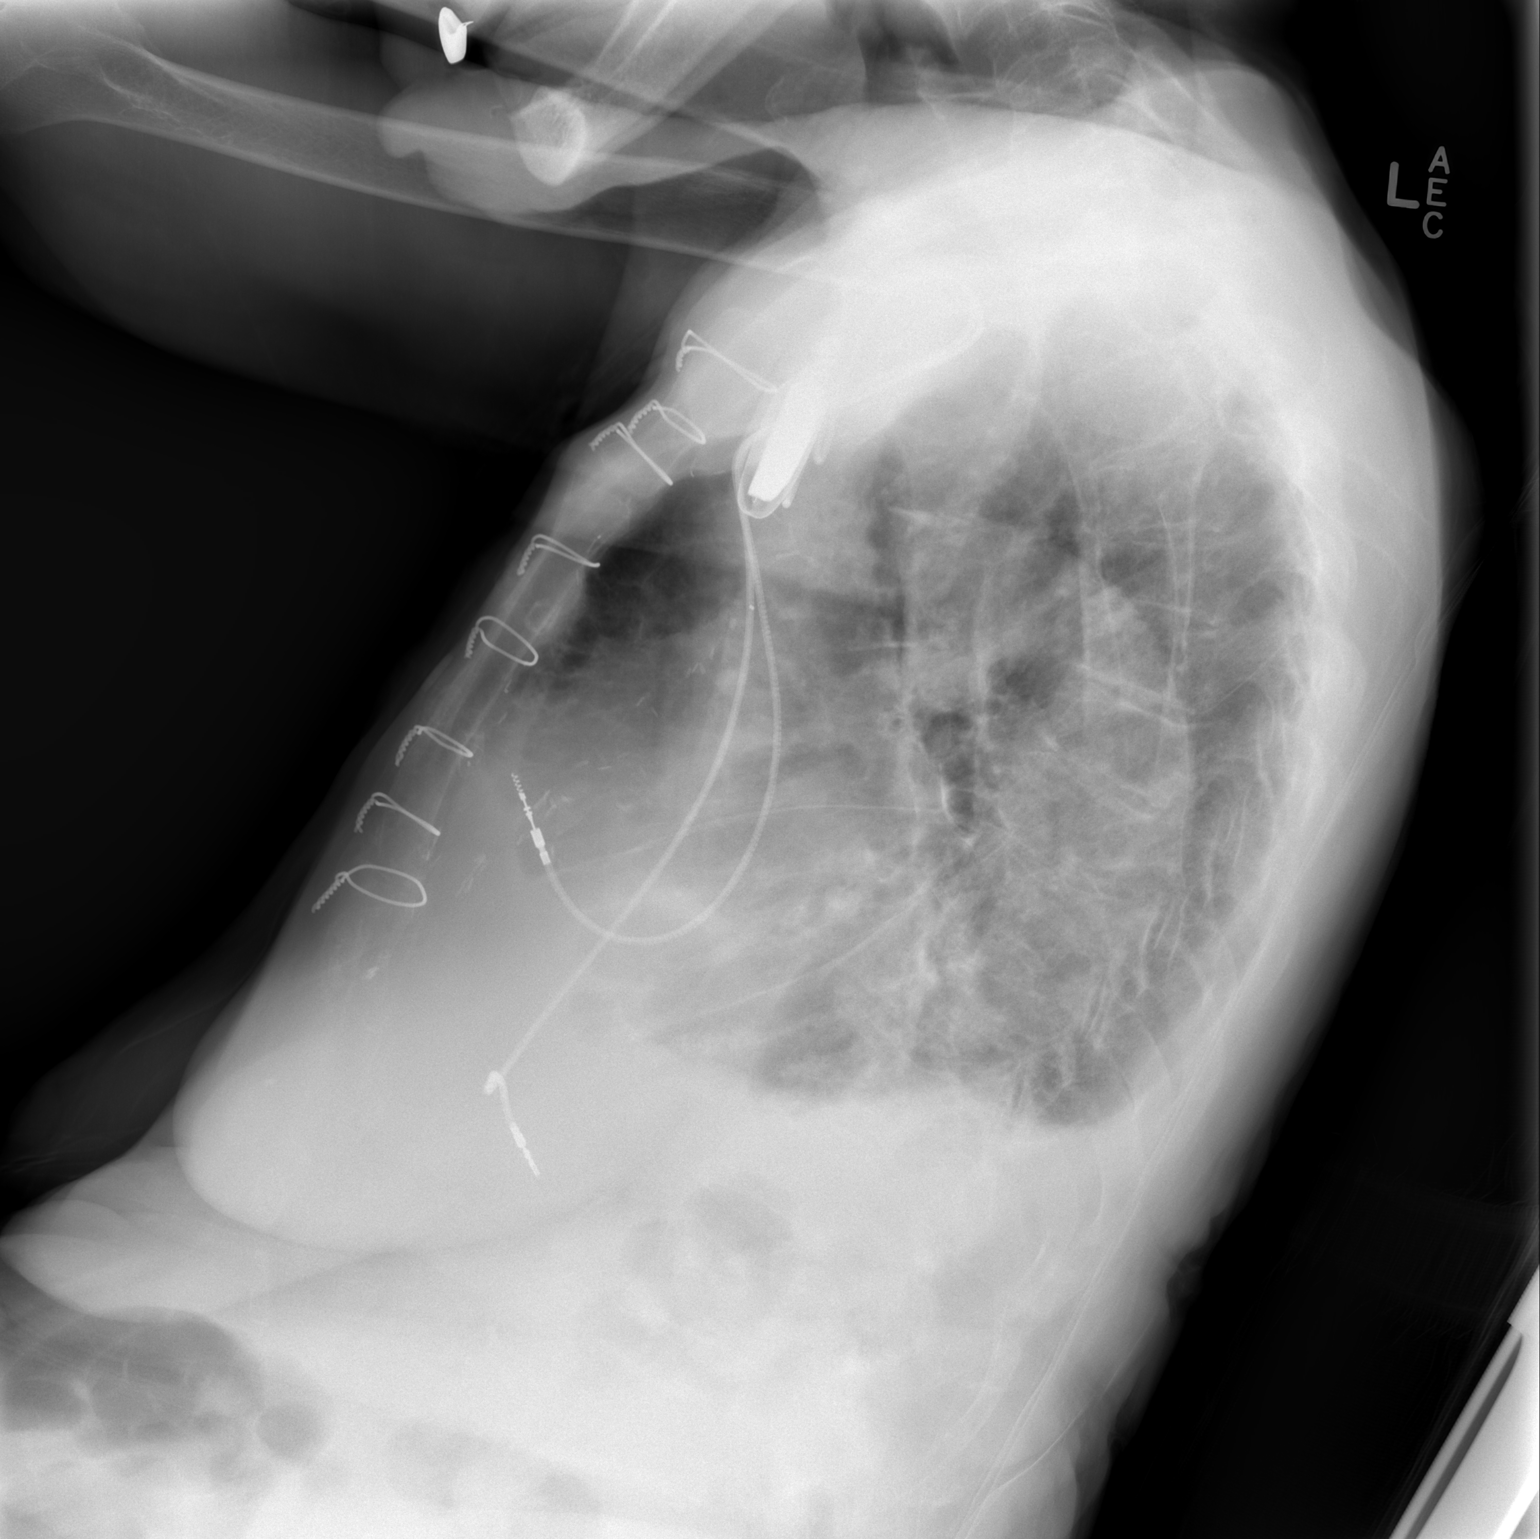

[view not recorded]
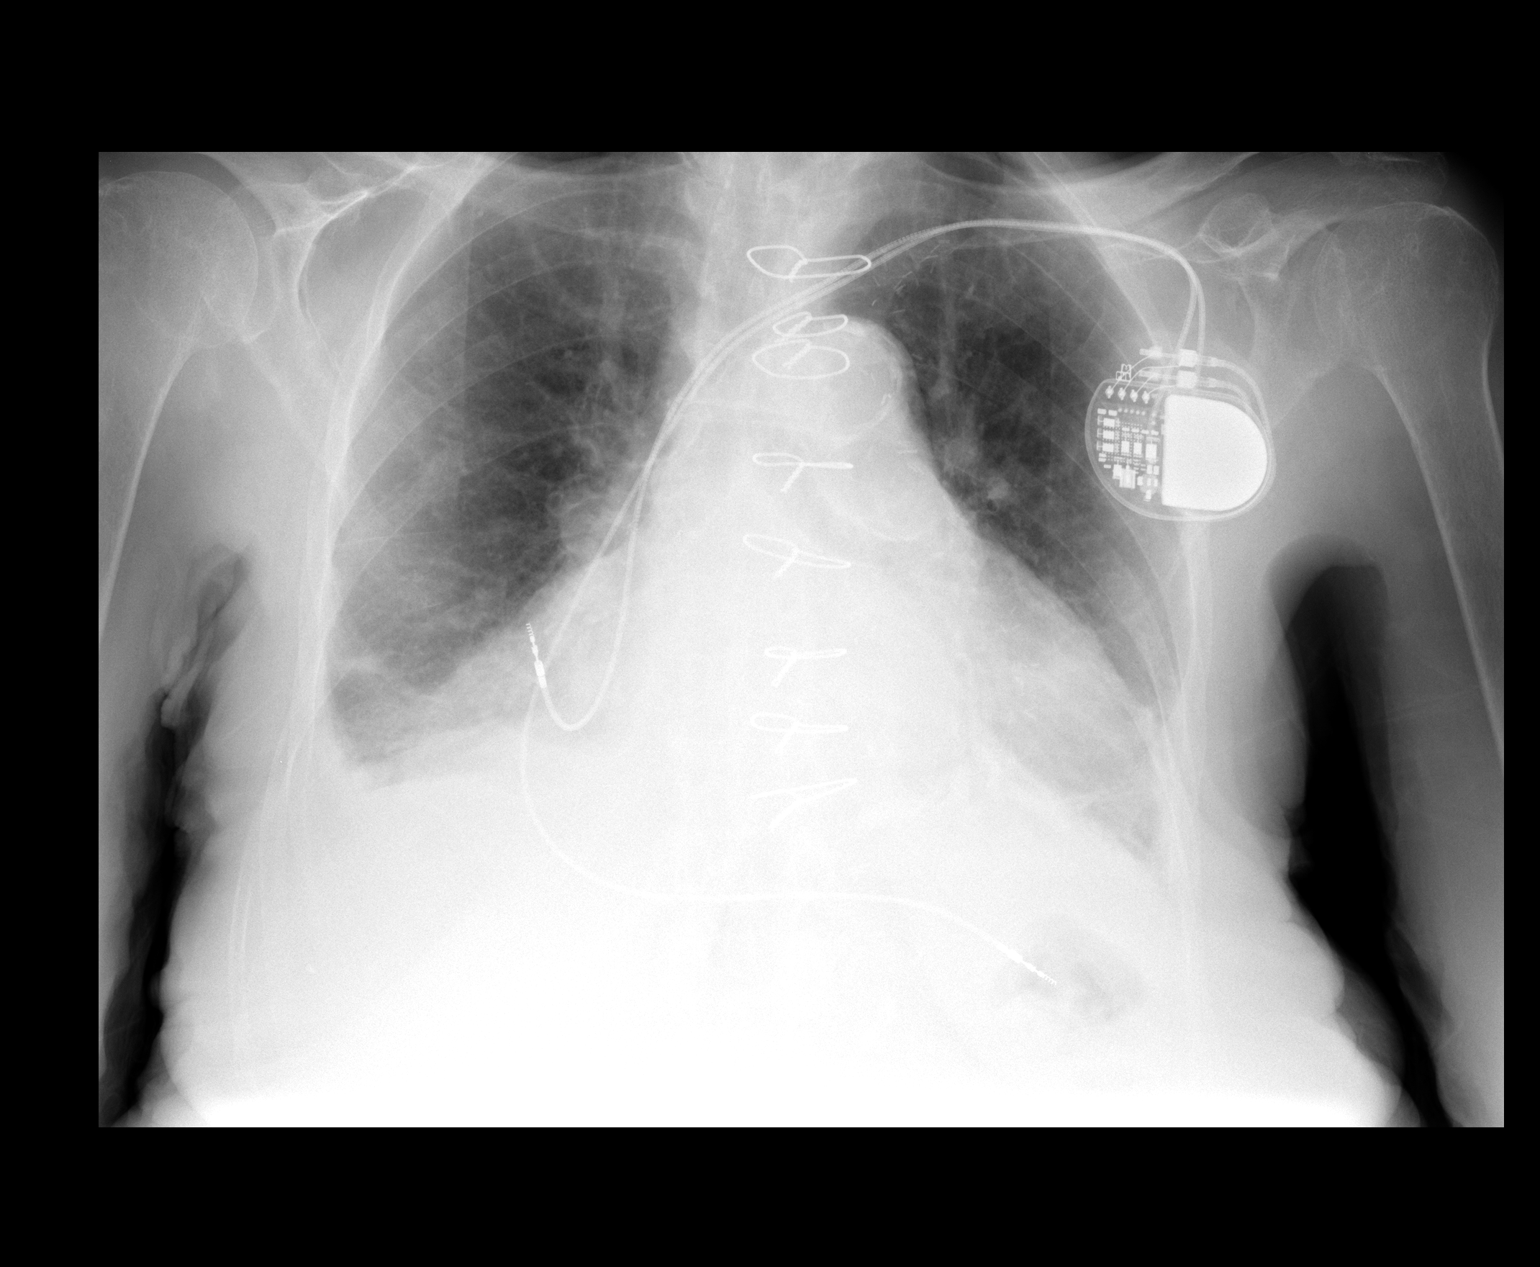

[2 of 2 positions shown; findings below may reference images not displayed]

FINDINGS: Small to moderate bilateral effusions are similar to the
prior study.  Bibasilar atelectasis is present.  Negative for edema
or vascular congestion.

Prior median sternotomy.  Dual lead pacemaker is unchanged.
IMPRESSION: Bilateral effusions and bibasilar atelectasis.  Negative for edema.

## 2013-06-07 ENCOUNTER — Emergency Department (HOSPITAL_COMMUNITY)
Admission: EM | Admit: 2013-06-07 | Discharge: 2013-06-07 | Disposition: A | Discharge status: Home / Self Care | Payer: Medicare Other | Attending: Emergency Medicine | Admitting: Emergency Medicine

## 2013-06-07 ENCOUNTER — Emergency Department (HOSPITAL_COMMUNITY): Payer: Medicare Other

## 2013-06-07 ENCOUNTER — Encounter (HOSPITAL_COMMUNITY): Payer: Self-pay | Admitting: Emergency Medicine

## 2013-06-07 DIAGNOSIS — Z79899 Other long term (current) drug therapy: Secondary | ICD-10-CM | POA: Insufficient documentation

## 2013-06-07 DIAGNOSIS — S6990XA Unspecified injury of unspecified wrist, hand and finger(s), initial encounter: Secondary | ICD-10-CM | POA: Insufficient documentation

## 2013-06-07 DIAGNOSIS — I509 Heart failure, unspecified: Secondary | ICD-10-CM | POA: Insufficient documentation

## 2013-06-07 DIAGNOSIS — N189 Chronic kidney disease, unspecified: Secondary | ICD-10-CM | POA: Insufficient documentation

## 2013-06-07 DIAGNOSIS — W07XXXA Fall from chair, initial encounter: Secondary | ICD-10-CM | POA: Insufficient documentation

## 2013-06-07 DIAGNOSIS — Y929 Unspecified place or not applicable: Secondary | ICD-10-CM | POA: Insufficient documentation

## 2013-06-07 DIAGNOSIS — S59909A Unspecified injury of unspecified elbow, initial encounter: Secondary | ICD-10-CM | POA: Insufficient documentation

## 2013-06-07 DIAGNOSIS — I2789 Other specified pulmonary heart diseases: Secondary | ICD-10-CM | POA: Insufficient documentation

## 2013-06-07 DIAGNOSIS — Z8639 Personal history of other endocrine, nutritional and metabolic disease: Secondary | ICD-10-CM | POA: Insufficient documentation

## 2013-06-07 DIAGNOSIS — Z862 Personal history of diseases of the blood and blood-forming organs and certain disorders involving the immune mechanism: Secondary | ICD-10-CM | POA: Insufficient documentation

## 2013-06-07 DIAGNOSIS — E119 Type 2 diabetes mellitus without complications: Secondary | ICD-10-CM | POA: Insufficient documentation

## 2013-06-07 DIAGNOSIS — Z951 Presence of aortocoronary bypass graft: Secondary | ICD-10-CM | POA: Insufficient documentation

## 2013-06-07 DIAGNOSIS — Y9389 Activity, other specified: Secondary | ICD-10-CM | POA: Insufficient documentation

## 2013-06-07 DIAGNOSIS — I2510 Atherosclerotic heart disease of native coronary artery without angina pectoris: Secondary | ICD-10-CM | POA: Insufficient documentation

## 2013-06-07 DIAGNOSIS — M25531 Pain in right wrist: Secondary | ICD-10-CM

## 2013-06-07 DIAGNOSIS — Z8673 Personal history of transient ischemic attack (TIA), and cerebral infarction without residual deficits: Secondary | ICD-10-CM | POA: Insufficient documentation

## 2013-06-07 NOTE — ED Notes (Addendum)
PT was sitting in chair and fell out of the chair onto her right wrist on Monday. PT complains of 10/10 pain.

## 2013-06-07 NOTE — ED Provider Notes (Signed)
CSN: [id number]     Arrival date & time 06/07/13  1243 History   First MD Initiated Contact with Patient 06/07/13 1250     Chief Complaint  Patient presents with  . Wrist Pain   (Consider location/radiation/quality/duration/timing/severity/associated sxs/prior Treatment) HPI Comments: 79 y/o female fell from her wheelchair on 7 days ago with foosh injury to the right wrist. The patient and her daughters state that she had no pain at that time., however 2 fays ago the patient began complaining of worsening pain in the right wrist and hand. The Patient's daughter wrapped her hand in gauze and coban. She has had worsening swelling in the digits since then. The patient denies any numbness or tingling.  She did not hit her head or loose consciousness during the fall. The patient states that she lost her balance. She is right hand dominant    Patient is a 79 y.o. female presenting with wrist pain. The history is provided by the patient, a relative and medical records. No language interpreter was used.  Wrist Pain This is a new problem. The current episode started in the past 7 days. The problem has been gradually worsening. Associated symptoms include joint swelling. Pertinent negatives include no abdominal pain, anorexia, arthralgias, change in bowel habit, chest pain, chills, congestion, coughing, diaphoresis, fatigue, fever, headaches, myalgias, nausea, neck pain, numbness, rash, sore throat, swollen glands, urinary symptoms, vertigo, visual change, vomiting or weakness. The symptoms are aggravated by bending. She has tried nothing for the symptoms.    Past Medical History  Diagnosis Date  . CHF (congestive heart failure)   . Coronary disease   . Pulmonary hypertension   . Diabetes mellitus   . Dyslipidemia   . CVA (cerebral infarction)   . Chronic renal insufficiency    Past Surgical History  Procedure Laterality Date  . Coronary artery bypass graft    . Total hip arthroplasty    .  Brain surgery      clipped anuerysm    History reviewed. No pertinent family history. History  Substance Use Topics  . Smoking status: Never Smoker   . Smokeless tobacco: Not on file  . Alcohol Use: No   OB History   Grav Para Term Preterm Abortions TAB SAB Ect Mult Living                 Review of Systems  Constitutional: Negative for fever, chills, diaphoresis and fatigue.  HENT: Negative for congestion, sore throat and neck pain.   Respiratory: Negative for cough.   Cardiovascular: Negative for chest pain.  Gastrointestinal: Negative for nausea, vomiting, abdominal pain, anorexia and change in bowel habit.  Musculoskeletal: Positive for joint swelling. Negative for myalgias and arthralgias.  Skin: Negative for rash.  Neurological: Negative for vertigo, weakness, numbness and headaches.    Allergies  Review of patient's allergies indicates no known allergies.  Home Medications   Current Outpatient Rx  Name  Route  Sig  Dispense  Refill  . bisacodyl (DULCOLAX) 5 MG EC tablet   Oral   Take 15 mg by mouth daily as needed for constipation.         . furosemide (LASIX) 80 MG tablet   Oral   Take 80 mg by mouth daily.         Marland Kitchen HYDROcodone-acetaminophen (NORCO/VICODIN) 5-325 MG per tablet   Oral   Take 1 tablet by mouth every 6 (six) hours as needed for pain.         Marland Kitchen  metolazone (ZAROXOLYN) 2.5 MG tablet   Oral   Take 5 mg by mouth 2 (two) times daily.         . potassium chloride (KLOR-CON) 10 MEQ CR tablet   Oral   Take 10 mEq by mouth daily.           BP 124/78  Pulse 58  Temp(Src) 98.4 F (36.9 C) (Oral)  Resp 22  Ht 5\' 6"  (1.676 m)  Wt 140 lb (63.504 kg)  BMI 22.61 kg/m2  SpO2 100% Physical Exam  Constitutional: She is oriented to person, place, and time. She appears well-developed and well-nourished. No distress.  HENT:  Head: Normocephalic and atraumatic.  Eyes: Conjunctivae are normal. No scleral icterus.  Neck: Normal range of  motion.  Cardiovascular: Normal rate, regular rhythm and normal heart sounds.  Exam reveals no gallop and no friction rub.   No murmur heard. Pulmonary/Chest: Effort normal and breath sounds normal. No respiratory distress.  Abdominal: Soft. Bowel sounds are normal. She exhibits no distension and no mass. There is no tenderness. There is no guarding.  Musculoskeletal:  Right wrist and digits tender and swollen. + scaphoid tenderness. N/V intact  Neurological: She is alert and oriented to person, place, and time.  Skin: Skin is warm and dry. She is not diaphoretic.    ED Course  Procedures (including critical care time) Labs Review Labs Reviewed - No data to display Imaging Review Dg Wrist Complete Right  06/07/2013   CLINICAL DATA:  Wrist pain and swelling post fall, initial encounter  EXAM: RIGHT WRIST - COMPLETE 3+ VIEW  COMPARISON:  None  FINDINGS: Osseous demineralization.  Minimal degenerative changes at STT joint.  Mild joint space narrowing at 1st and 5th MCP joints.  Mild radiocarpal joint space degenerative changes also present.  Remaining joint spaces preserved.  Soft tissue swelling at radial border of wrist and distal radius.  No acute fracture, dislocation or bone destruction.  IMPRESSION: Osseous demineralization with scattered degenerative changes.  No acute abnormalities.   Electronically Signed   By: Ulyses Southward M.D.   On: 06/07/2013 13:32   Dg Hand Complete Right  06/07/2013   CLINICAL DATA:  Right hand and wrist pain.  EXAM: RIGHT HAND - COMPLETE 3+ VIEW  COMPARISON:  Wrist today.  FINDINGS: Osteopenia. Soft tissue swelling is present over the dorsum of the hand in the wrist. There are small marginal erosions at the ulnar aspect of the MCP joints involving the index, long and ring fingers. Basal joint of the thumb and STT joint osteoarthritis is present. Lucency of the ulnar styloid appears to be related to the osteopenia rather than erosion. No definite carpal bone erosions.  DIP joint osteoarthritis is present. First MCP joint osteoarthritis is also present.  IMPRESSION: 1. Small erosions along the ulnar aspect of the MCP joints. These are suggestive of inflammatory arthropathy such as rheumatoid arthritis which may be chronic. 2. Osteoarthritis of the hand. 3. Osteopenia.   Electronically Signed   By: Andreas Newport M.D.   On: 06/07/2013 14:56    MDM   1. Wrist pain, acute, right    Patient seen in shared visit with Dr. Criss Alvine. No acute abnormality on plain films.  Patinet with + scaphoid tenderness.  Given thumb spica splint. Patient should follow closely with hand. She has pain medications at home. The patient appears reasonably screened and/or stabilized for discharge and I doubt any other medical condition or other [facility] requiring further screening, evaluation, or treatment  in the ED at this time prior to discharge.     Arthor Captain, PA-C 06/07/13 1848

## 2013-06-07 NOTE — ED Notes (Signed)
Wrist currently wrapped by pt's daughter. PA to remove wrapping for assessment. PA at bedside.

## 2013-06-07 NOTE — ED Notes (Signed)
Fall rt wrist pain with swelling to hands and fingers. Pain

## 2013-06-08 NOTE — ED Provider Notes (Signed)
Medical screening examination/treatment/procedure(s) were conducted as a shared visit with non-physician practitioner(s) and myself.  I personally evaluated the patient during the encounter   Patient with a mechanical fall out of chair a few days ago. Normal xrays of hand and wrist except for swelling and arthritis. NV intact. Does have scaphoid tenderness, will put in splint and arrange f/u with ortho  Audree Camel, MD 06/08/13 1109

## 2013-09-07 ENCOUNTER — Encounter (HOSPITAL_COMMUNITY): Payer: Self-pay | Admitting: Emergency Medicine

## 2013-09-07 ENCOUNTER — Emergency Department (HOSPITAL_COMMUNITY): Payer: Medicare Other

## 2013-09-07 ENCOUNTER — Inpatient Hospital Stay (HOSPITAL_COMMUNITY)
Admission: EM | Admit: 2013-09-07 | Discharge: 2013-09-10 | DRG: 871 | Disposition: E | Payer: Medicare Other | Attending: Internal Medicine | Admitting: Internal Medicine

## 2013-09-07 DIAGNOSIS — Z515 Encounter for palliative care: Secondary | ICD-10-CM

## 2013-09-07 DIAGNOSIS — R109 Unspecified abdominal pain: Secondary | ICD-10-CM | POA: Diagnosis present

## 2013-09-07 DIAGNOSIS — R0600 Dyspnea, unspecified: Secondary | ICD-10-CM

## 2013-09-07 DIAGNOSIS — R0609 Other forms of dyspnea: Secondary | ICD-10-CM | POA: Diagnosis present

## 2013-09-07 DIAGNOSIS — I4891 Unspecified atrial fibrillation: Secondary | ICD-10-CM | POA: Diagnosis present

## 2013-09-07 DIAGNOSIS — Z8673 Personal history of transient ischemic attack (TIA), and cerebral infarction without residual deficits: Secondary | ICD-10-CM

## 2013-09-07 DIAGNOSIS — R52 Pain, unspecified: Secondary | ICD-10-CM

## 2013-09-07 DIAGNOSIS — Z951 Presence of aortocoronary bypass graft: Secondary | ICD-10-CM

## 2013-09-07 DIAGNOSIS — E785 Hyperlipidemia, unspecified: Secondary | ICD-10-CM | POA: Diagnosis present

## 2013-09-07 DIAGNOSIS — I509 Heart failure, unspecified: Secondary | ICD-10-CM | POA: Diagnosis present

## 2013-09-07 DIAGNOSIS — E119 Type 2 diabetes mellitus without complications: Secondary | ICD-10-CM | POA: Diagnosis present

## 2013-09-07 DIAGNOSIS — N179 Acute kidney failure, unspecified: Secondary | ICD-10-CM

## 2013-09-07 DIAGNOSIS — N259 Disorder resulting from impaired renal tubular function, unspecified: Secondary | ICD-10-CM | POA: Diagnosis present

## 2013-09-07 DIAGNOSIS — Z66 Do not resuscitate: Secondary | ICD-10-CM | POA: Diagnosis present

## 2013-09-07 DIAGNOSIS — E1169 Type 2 diabetes mellitus with other specified complication: Secondary | ICD-10-CM | POA: Diagnosis present

## 2013-09-07 DIAGNOSIS — D65 Disseminated intravascular coagulation [defibrination syndrome]: Secondary | ICD-10-CM | POA: Diagnosis present

## 2013-09-07 DIAGNOSIS — I10 Essential (primary) hypertension: Secondary | ICD-10-CM | POA: Diagnosis present

## 2013-09-07 DIAGNOSIS — T68XXXA Hypothermia, initial encounter: Secondary | ICD-10-CM

## 2013-09-07 DIAGNOSIS — Z7982 Long term (current) use of aspirin: Secondary | ICD-10-CM

## 2013-09-07 DIAGNOSIS — R5381 Other malaise: Secondary | ICD-10-CM | POA: Diagnosis present

## 2013-09-07 DIAGNOSIS — E860 Dehydration: Secondary | ICD-10-CM

## 2013-09-07 DIAGNOSIS — Z833 Family history of diabetes mellitus: Secondary | ICD-10-CM

## 2013-09-07 DIAGNOSIS — IMO0002 Reserved for concepts with insufficient information to code with codable children: Secondary | ICD-10-CM

## 2013-09-07 DIAGNOSIS — N183 Chronic kidney disease, stage 3 unspecified: Secondary | ICD-10-CM | POA: Diagnosis present

## 2013-09-07 DIAGNOSIS — Z96649 Presence of unspecified artificial hip joint: Secondary | ICD-10-CM

## 2013-09-07 DIAGNOSIS — I2510 Atherosclerotic heart disease of native coronary artery without angina pectoris: Secondary | ICD-10-CM | POA: Diagnosis present

## 2013-09-07 DIAGNOSIS — R4182 Altered mental status, unspecified: Secondary | ICD-10-CM | POA: Diagnosis present

## 2013-09-07 DIAGNOSIS — E162 Hypoglycemia, unspecified: Secondary | ICD-10-CM | POA: Diagnosis present

## 2013-09-07 DIAGNOSIS — Z7401 Bed confinement status: Secondary | ICD-10-CM

## 2013-09-07 DIAGNOSIS — D696 Thrombocytopenia, unspecified: Secondary | ICD-10-CM | POA: Diagnosis present

## 2013-09-07 DIAGNOSIS — I129 Hypertensive chronic kidney disease with stage 1 through stage 4 chronic kidney disease, or unspecified chronic kidney disease: Secondary | ICD-10-CM | POA: Diagnosis present

## 2013-09-07 DIAGNOSIS — R748 Abnormal levels of other serum enzymes: Secondary | ICD-10-CM

## 2013-09-07 DIAGNOSIS — I2789 Other specified pulmonary heart diseases: Secondary | ICD-10-CM | POA: Diagnosis present

## 2013-09-07 DIAGNOSIS — R0989 Other specified symptoms and signs involving the circulatory and respiratory systems: Secondary | ICD-10-CM | POA: Diagnosis present

## 2013-09-07 DIAGNOSIS — N390 Urinary tract infection, site not specified: Secondary | ICD-10-CM | POA: Diagnosis present

## 2013-09-07 DIAGNOSIS — A419 Sepsis, unspecified organism: Secondary | ICD-10-CM

## 2013-09-07 DIAGNOSIS — A4189 Other specified sepsis: Principal | ICD-10-CM | POA: Diagnosis present

## 2013-09-07 LAB — GLUCOSE, CAPILLARY
Glucose-Capillary: <10 mg/dL — CL (ref 70–99)
Glucose-Capillary: 38 mg/dL — CL (ref 70–99)
Glucose-Capillary: <10 mg/dL — CL (ref 70–99)

## 2013-09-07 LAB — COMPREHENSIVE METABOLIC PANEL
ALT: 19 U/L (ref 0–35)
AST: 32 U/L (ref 0–37)
Albumin: 1.9 g/dL — ABNORMAL LOW (ref 3.5–5.2)
CO2: 21 mEq/L (ref 19–32)
Calcium: 9.6 mg/dL (ref 8.4–10.5)
Chloride: 102 mEq/L (ref 96–112)
Creatinine, Ser: 3.49 mg/dL — ABNORMAL HIGH (ref 0.50–1.10)
Sodium: 138 mEq/L (ref 135–145)

## 2013-09-07 LAB — PROTIME-INR: INR: 3.39 — ABNORMAL HIGH (ref 0.00–1.49)

## 2013-09-07 LAB — ABO/RH: ABO/RH(D): O NEG

## 2013-09-07 MED ORDER — SODIUM CHLORIDE 0.9 % IV SOLN
1000.0000 mL | INTRAVENOUS | Status: DC
  Administered 2013-09-07: 1000 mL via INTRAVENOUS

## 2013-09-07 MED ORDER — DEXTROSE 50 % IV SOLN
INTRAVENOUS | Status: AC
  Administered 2013-09-07: 25 g via INTRAVENOUS
  Filled 2013-09-07: qty 50

## 2013-09-07 MED ORDER — SODIUM CHLORIDE 0.9 % IV SOLN
1000.0000 mL | Freq: Once | INTRAVENOUS | Status: AC
  Administered 2013-09-07: 1000 mL via INTRAVENOUS

## 2013-09-07 MED ORDER — DEXTROSE 50 % IV SOLN
1.0000 | Freq: Once | INTRAVENOUS | Status: AC
  Administered 2013-09-07: 50 mL via INTRAVENOUS
  Filled 2013-09-07: qty 50

## 2013-09-07 MED ORDER — DEXTROSE 50 % IV SOLN
25.0000 g | Freq: Once | INTRAVENOUS | Status: AC
  Administered 2013-09-07: 25 g via INTRAVENOUS

## 2013-09-07 NOTE — Progress Notes (Signed)
   [facility] MANAG[facility] ED NOTE 08/19/2013  Patient:  [name]   Account Number:  [id number]  Date Initiated:  08/21/2013  Documentation initiated by:  Radford Pax  Subjective/Objective Assessment:   Patient presents  to ED with shortness of breath and low blood sugar.     Subjective/Objective Assessment Detail:   Patient has a history of diaetes and chf.     Action/Plan:   Action/Plan Detail:   Anticipated DC Date:       Status Recommendation to Physician:   Result of Recommendation:    Other ED Services  Consult Working Plan    DC Planning Services  Other  PCP issues    Choice offered to / List presented to:            Status of service:  Completed, signed off  ED Comments:   ED Comments Detail:  EDCM spoke to patient and her daughter Taylor [facility] at bedside.  Patient's daughter reports the patient lives with her.  Patient was receiving home hospice under the [facility], but they dismissed the patient because they thought she was doing better.  Patient has oxygen at home through [facility].  Patient only uses it when she absolutely needs it.  Patient's daughter also reports that [facility] comes to the house and changes the dressings on the patient's legs at least twice a week. Patient has a walker, wheelchair, shower chair and bedside commode at home.  Patient's daughter's phone number to call if needed [phone].  Patient's daughter also reports that [facility], comes out to the patient's house once a month.  Patient's daughter believes Ms. [facility] is a NP, and is the patient's pcp.  Patient's daughter reports she can use all the help she can get at home.  No further CM needs at this time.

## 2013-09-07 NOTE — ED Notes (Signed)
Bed: ZO10 Expected date:  Expected time:  Means of arrival:  Comments: EMS/79 yo female with SOB-on NRB

## 2013-09-07 NOTE — ED Notes (Signed)
Pt. cbg and lab works is difficult to obtain. Pt. Not bleeding well.

## 2013-09-07 NOTE — ED Notes (Signed)
Pt cbg at 2133 was 38. MD made aware. CBG not crossing over correctly.

## 2013-09-07 NOTE — ED Notes (Signed)
Pt. CBG too low to register. Md made aware. Per family, pt is at baseline, asymptomatic.

## 2013-09-07 NOTE — ED Notes (Signed)
Patient from home, she stopped eating several days ago and now has a blood sugar of 25.  EMS was able initially to start IV, but dextrose infiltrated IV.  Patient not alert enough for PO meds.  Multiple areas of 4+ edema, numerous pressure ulcers on sacral area and blisters on the back of her legs.  Patient is a DNR, has not been able to take her meds for one week.  Patient has a pacemaker.

## 2013-09-07 NOTE — ED Notes (Signed)
Bed: WA21 Expected date:  Expected time:  Means of arrival:  Comments: EMS/angioedema-on lisinopril

## 2013-09-07 NOTE — ED Provider Notes (Addendum)
CSN: [id number]     Arrival date & time 09/08/2013  1905 History  First MD Initiated Contact with Patient 08/10/2013 1929     Chief Complaint  Patient presents with  . Hypoglycemia   HPI The patient was brought in by EMS for altered mental status.  The patient has history of congestive heart failure as well as diabetes. About a year ago, she was on hospice because of her severe end-stage congestive heart failure. The patient was taken off hospice primarily because she did not pass away although her medical condition did not get that much better.  The family states over the last couple of weeks she has been progressively getting worse. The patient has not been eating or drinking well .  She also has gradually become less responsive. At baseline the patient is bedridden and does not communicate that much. . EMS found her to be hypoglycemic.  The patient was given D50 although the IV may have infiltrated.  The family does confirm that the patient is a DO NOT RESUSCITATE. Past Medical History  Diagnosis Date  . CHF (congestive heart failure)   . Coronary disease   . Pulmonary hypertension   . Diabetes mellitus   . Dyslipidemia   . CVA (cerebral infarction)   . Chronic renal insufficiency    Past Surgical History  Procedure Laterality Date  . Coronary artery bypass graft    . Total hip arthroplasty    . Brain surgery      clipped anuerysm    No family history on file. History  Substance Use Topics  . Smoking status: Never Smoker   . Smokeless tobacco: Not on file  . Alcohol Use: No   OB History   Grav Para Term Preterm Abortions TAB SAB Ect Mult Living                 Review of Systems  All other systems reviewed and are negative.    Allergies  Review of patient's allergies indicates no known allergies.  Home Medications   Current Outpatient Rx  Name  Route  Sig  Dispense  Refill  . aspirin EC 81 MG tablet   Oral   Take 81 mg by mouth daily.         . furosemide (LASIX)  40 MG tablet   Oral   Take 60 mg by mouth daily.         Marland Kitchen HYDROcodone-acetaminophen (NORCO/VICODIN) 5-325 MG per tablet   Oral   Take 1 tablet by mouth every 6 (six) hours as needed for pain.         . potassium chloride (KLOR-CON) 10 MEQ CR tablet   Oral   Take 20 mEq by mouth daily.          Marland Kitchen senna-docusate (SENOKOT-S) 8.6-50 MG per tablet   Oral   Take 1 tablet by mouth daily.          BP 116/56  Pulse 73  Temp(Src) 91.9 F (33.3 C) (Rectal)  Resp 26  SpO2 % Physical Exam  Nursing note and vitals reviewed. Constitutional: She appears lethargic. No distress.  Frail elderly  HENT:  Head: Normocephalic and atraumatic.  Right Ear: External ear normal.  Left Ear: External ear normal.  Mouth/Throat: No oropharyngeal exudate.  Mucous membranes dry  Eyes: Conjunctivae are normal. Right eye exhibits no discharge. Left eye exhibits no discharge. No scleral icterus.  Neck: Neck supple. No tracheal deviation present.  Cardiovascular: Normal rate, regular rhythm  and intact distal pulses.   Pulmonary/Chest: Effort normal and breath sounds normal. No stridor. No respiratory distress. She has no wheezes. She has no rales.  Abdominal: Soft. Bowel sounds are normal. She exhibits no distension. There is no tenderness. There is no rebound and no guarding.  Musculoskeletal: She exhibits edema (1+ bilateral, compression dressings in place). She exhibits no tenderness.  Neurological: She appears lethargic. She displays no tremor. Cranial nerve deficit:  no gross defecits noted. She exhibits normal muscle tone. She displays no seizure activity. Coordination normal. GCS eye subscore is 3. GCS verbal subscore is 4. GCS motor subscore is 5.  Generalized weakness, does not follow commands  Skin: Skin is warm and dry. No rash noted. She is not diaphoretic.  Few superficial ulcerations on her thighs and back consistent with decubitus ulcers    ED Course  Procedures (including critical  care time) 2143  Blood sugar is still low after intial amp of d50.  Will give an additional d50 amp.  Labs pending.  Pt does appear more alert. 2300  Lab was not able to draw labs.  I was able to draw blood from the right femoral vein using ultrasound guidance.  Repeat CBG is now 179.  Will continue to monitor. 34  Updated family of findings.  They are not interested in aggressive measures.  Will admit for IV hydration, comfort care.  CRITICAL CARE Performed by: Celene Kras Total critical care time: 40 Critical care time was exclusive of separately billable procedures and treating other patients. Critical care was necessary to treat or prevent imminent or life-threatening deterioration. Critical care was time spent personally by me on the following activities: development of treatment plan with patient and/or surrogate as well as nursing, discussions with consultants, evaluation of patient's response to treatment, examination of patient, obtaining history from patient or surrogate, ordering and performing treatments and interventions, ordering and review of laboratory studies, ordering and review of radiographic studies, pulse oximetry and re-evaluation of patient's condition.  Labs Review Labs Reviewed  CBC WITH DIFFERENTIAL - Abnormal; Notable for the following:    Hemoglobin 10.9 (*)    HCT 31.9 (*)    RDW 16.9 (*)    Platelets 36 (*)    Neutrophils Relative % 95 (*)    Lymphocytes Relative 2 (*)    Neutro Abs 7.9 (*)    Lymphs Abs 0.2 (*)    All other components within normal limits  COMPREHENSIVE METABOLIC PANEL - Abnormal; Notable for the following:    Glucose, Bld 200 (*)    BUN 55 (*)    Creatinine, Ser 3.49 (*)    Total Protein 5.1 (*)    Albumin 1.9 (*)    Total Bilirubin 2.5 (*)    GFR calc non Af Amer 10 (*)    GFR calc Af Amer 12 (*)    All other components within normal limits  PROTIME-INR - Abnormal; Notable for the following:    Prothrombin Time 33.0 (*)    INR  3.39 (*)    All other components within normal limits  GLUCOSE, CAPILLARY - Abnormal; Notable for the following:    Glucose-Capillary <10 (*)    All other components within normal limits  GLUCOSE, CAPILLARY - Abnormal; Notable for the following:    Glucose-Capillary <10 (*)    All other components within normal limits  GLUCOSE, CAPILLARY - Abnormal; Notable for the following:    Glucose-Capillary <10 (*)    All other components within normal limits  GLUCOSE, CAPILLARY - Abnormal; Notable for the following:    Glucose-Capillary <10 (*)    All other components within normal limits  GLUCOSE, CAPILLARY - Abnormal; Notable for the following:    Glucose-Capillary 38 (*)    All other components within normal limits  GLUCOSE, CAPILLARY - Abnormal; Notable for the following:    Glucose-Capillary 23 (*)    All other components within normal limits  GLUCOSE, CAPILLARY - Abnormal; Notable for the following:    Glucose-Capillary 179 (*)    All other components within normal limits  CG4 I-STAT (LACTIC ACID) - Abnormal; Notable for the following:    Lactic Acid, Venous 5.18 (*)    All other components within normal limits  POCT I-STAT TROPONIN I - Abnormal; Notable for the following:    Troponin i, poc 1.02 (*)    All other components within normal limits  CULTURE, BLOOD (ROUTINE X 2)  CULTURE, BLOOD (ROUTINE X 2)  URINE CULTURE  LIPASE, BLOOD  URINALYSIS, ROUTINE W REFLEX MICROSCOPIC  TYPE AND SCREEN  ABO/RH   Imaging Review Dg Chest Port 1 View  08/13/2013   CLINICAL DATA:  Hypoglycemia, weakness  EXAM: PORTABLE CHEST - 1 VIEW  COMPARISON:  02/05/2011; 11/03/2009  FINDINGS: Grossly unchanged enlarged cardiac silhouette and mediastinal contours with slight differences attributable to patient rotation. Post median sternotomy. Stable position of support apparatus. Atherosclerotic plaque within the thoracic aorta. Grossly unchanged chronic small bilateral effusions and associated bibasilar  opacities. Chronic pulmonary venous congestion without definite evidence of edema. No new focal airspace opacities. No definite pneumothorax. Grossly unchanged bones. Faster calcifications overlie the right axilla.  IMPRESSION: 1.  No acute cardiopulmonary disease. 2. Chronic findings of cardiomegaly, chronic pulmonary venous congestion, small bilateral effusions and associated bibasilar atelectasis.   Electronically Signed   By: Simonne Come M.D.   On: 09/08/2013 21:13    EKG Rate 73 electronically paced rhythm Bundle branch block pattern Pacing is new since prior EKG dated 2012  Medications  0.9 %  sodium chloride infusion (0 mLs Intravenous Stopped 08/12/2013 2131)    Followed by  0.9 %  sodium chloride infusion (1,000 mLs Intravenous New Bag/Given 08/29/2013 2131)  piperacillin-tazobactam (ZOSYN) IVPB 3.375 g (not administered)  vancomycin (VANCOCIN) IVPB 1000 mg/200 mL premix (not administered)  dextrose 50 % solution 25 g (25 g Intravenous Given 08/11/2013 2105)  dextrose 50 % solution 50 mL (50 mLs Intravenous Given 08/17/2013 2138)     MDM   1. Acute renal failure   2. Dehydration   3. Cardiac enzymes elevated   4. Hypoglycemia   5. Hypothermia, initial encounter    Plan on admission for IV hydration.  Pt with acute renal failure and hypglycemia.  Likely related to poor po intake.  Lasix could be contributing to her dehydration.  Elevated cardiac enzymes may be related to cardiac disease or could be from her renal failure.  Regardless, family is not interested in cardiac intervention.  Elevated lactic acid level and hypothermic.  Will cover with empiric abx although definitive source of bacterial infection at this time.    Celene Kras, MD 09/08/13 1610  Celene Kras, MD 09/08/13 [phone]

## 2013-09-08 ENCOUNTER — Encounter (HOSPITAL_COMMUNITY): Payer: Self-pay | Admitting: Internal Medicine

## 2013-09-08 DIAGNOSIS — Z515 Encounter for palliative care: Secondary | ICD-10-CM

## 2013-09-08 DIAGNOSIS — R0600 Dyspnea, unspecified: Secondary | ICD-10-CM | POA: Diagnosis present

## 2013-09-08 DIAGNOSIS — R52 Pain, unspecified: Secondary | ICD-10-CM

## 2013-09-08 DIAGNOSIS — N179 Acute kidney failure, unspecified: Secondary | ICD-10-CM | POA: Diagnosis present

## 2013-09-08 DIAGNOSIS — R0609 Other forms of dyspnea: Secondary | ICD-10-CM

## 2013-09-08 DIAGNOSIS — E162 Hypoglycemia, unspecified: Secondary | ICD-10-CM

## 2013-09-08 DIAGNOSIS — R748 Abnormal levels of other serum enzymes: Secondary | ICD-10-CM

## 2013-09-08 DIAGNOSIS — A419 Sepsis, unspecified organism: Secondary | ICD-10-CM

## 2013-09-08 LAB — GLUCOSE, CAPILLARY
Glucose-Capillary: 67 mg/dL — ABNORMAL LOW (ref 70–99)
Glucose-Capillary: 154 mg/dL — ABNORMAL HIGH (ref 70–99)

## 2013-09-08 LAB — URINE MICROSCOPIC-ADD ON

## 2013-09-08 LAB — URINALYSIS, ROUTINE W REFLEX MICROSCOPIC
Glucose, UA: NEGATIVE mg/dL
Specific Gravity, Urine: 1.017 (ref 1.005–1.030)

## 2013-09-08 LAB — CBC WITH DIFFERENTIAL/PLATELET
Basophils Absolute: 0 10*3/uL (ref 0.0–0.1)
Eosinophils Relative: 0 % (ref 0–5)
Lymphs Abs: 0.2 10*3/uL — ABNORMAL LOW (ref 0.7–4.0)
MCV: 78.8 fL (ref 78.0–100.0)
Monocytes Absolute: 0.3 10*3/uL (ref 0.1–1.0)
Monocytes Relative: 3 % (ref 3–12)
Neutrophils Relative %: 95 % — ABNORMAL HIGH (ref 43–77)
Platelets: 36 10*3/uL — ABNORMAL LOW (ref 150–400)
RBC: 4.05 MIL/uL (ref 3.87–5.11)
RDW: 16.9 % — ABNORMAL HIGH (ref 11.5–15.5)
WBC: 8.4 10*3/uL (ref 4.0–10.5)

## 2013-09-08 LAB — CG4 I-STAT (LACTIC ACID): Lactic Acid, Venous: 5.18 mmol/L — ABNORMAL HIGH (ref 0.5–2.2)

## 2013-09-08 LAB — TYPE AND SCREEN

## 2013-09-08 LAB — POCT I-STAT TROPONIN I: Troponin i, poc: 1.02 ng/mL (ref 0.00–0.08)

## 2013-09-08 MED ORDER — ACETAMINOPHEN 325 MG PO TABS: 650.0000 mg | ORAL_TABLET | Freq: Four times a day (QID) | ORAL | Status: DC | PRN

## 2013-09-08 MED ORDER — MORPHINE SULFATE 2 MG/ML IJ SOLN
1.0000 mg | INTRAMUSCULAR | Status: DC
  Administered 2013-09-08 – 2013-09-09 (×7): 1 mg via INTRAVENOUS
  Filled 2013-09-08 (×6): qty 1

## 2013-09-08 MED ORDER — MORPHINE SULFATE 2 MG/ML IJ SOLN
1.0000 mg | INTRAMUSCULAR | Status: DC | PRN
  Filled 2013-09-08 (×2): qty 1

## 2013-09-08 MED ORDER — VANCOMYCIN HCL IN DEXTROSE 1-5 GM/200ML-% IV SOLN
1000.0000 mg | Freq: Once | INTRAVENOUS | Status: AC
  Administered 2013-09-08: 1000 mg via INTRAVENOUS
  Filled 2013-09-08: qty 200

## 2013-09-08 MED ORDER — LORAZEPAM 2 MG/ML IJ SOLN: 0.5000 mg | Freq: Four times a day (QID) | INTRAMUSCULAR | Status: DC | PRN

## 2013-09-08 MED ORDER — ONDANSETRON HCL 4 MG/2ML IJ SOLN: 4.0000 mg | Freq: Four times a day (QID) | INTRAMUSCULAR | Status: DC | PRN

## 2013-09-08 MED ORDER — MORPHINE SULFATE 2 MG/ML IJ SOLN: 2.0000 mg | INTRAMUSCULAR | Status: DC | PRN

## 2013-09-08 MED ORDER — DOCUSATE SODIUM 100 MG PO CAPS
100.0000 mg | ORAL_CAPSULE | Freq: Two times a day (BID) | ORAL | Status: DC
  Filled 2013-09-08 (×4): qty 1

## 2013-09-08 MED ORDER — ONDANSETRON HCL 4 MG PO TABS: 4.0000 mg | ORAL_TABLET | Freq: Four times a day (QID) | ORAL | Status: DC | PRN

## 2013-09-08 MED ORDER — MORPHINE SULFATE 2 MG/ML IJ SOLN: 1.0000 mg | INTRAMUSCULAR | Status: DC | PRN

## 2013-09-08 MED ORDER — TOBRAMYCIN 0.3 % OP SOLN
2.0000 [drp] | Freq: Four times a day (QID) | OPHTHALMIC | Status: DC
  Administered 2013-09-08 – 2013-09-09 (×3): 2 [drp] via OPHTHALMIC
  Filled 2013-09-08: qty 5

## 2013-09-08 MED ORDER — SODIUM CHLORIDE 0.9 % IJ SOLN: 3.0000 mL | INTRAMUSCULAR | Status: DC | PRN

## 2013-09-08 MED ORDER — HYDROCODONE-ACETAMINOPHEN 5-325 MG PO TABS
1.0000 | ORAL_TABLET | ORAL | Status: DC | PRN
  Administered 2013-09-08: 1 via ORAL
  Filled 2013-09-08: qty 1

## 2013-09-08 MED ORDER — LORAZEPAM 2 MG/ML IJ SOLN
1.0000 mg | INTRAMUSCULAR | Status: DC | PRN
  Administered 2013-09-08: 1 mg via INTRAVENOUS
  Filled 2013-09-08: qty 1

## 2013-09-08 MED ORDER — ACETAMINOPHEN 650 MG RE SUPP: 650.0000 mg | Freq: Four times a day (QID) | RECTAL | Status: DC | PRN

## 2013-09-08 MED ORDER — PIPERACILLIN-TAZOBACTAM IN DEX 2-0.25 GM/50ML IV SOLN
2.2500 g | Freq: Four times a day (QID) | INTRAVENOUS | Status: DC
  Filled 2013-09-08: qty 50

## 2013-09-08 MED ORDER — MORPHINE SULFATE 2 MG/ML IJ SOLN: 1.0000 mg | INTRAMUSCULAR | Status: DC

## 2013-09-08 MED ORDER — SODIUM CHLORIDE 0.9 % IJ SOLN: 3.0000 mL | Freq: Two times a day (BID) | INTRAMUSCULAR | Status: DC

## 2013-09-08 MED ORDER — PIPERACILLIN-TAZOBACTAM 3.375 G IVPB 30 MIN
3.3750 g | Freq: Once | INTRAVENOUS | Status: AC
  Administered 2013-09-08: 3.375 g via INTRAVENOUS
  Filled 2013-09-08: qty 50

## 2013-09-08 MED ORDER — DEXTROSE 5 % IV SOLN
1.0000 g | Freq: Every day | INTRAVENOUS | Status: DC
  Administered 2013-09-08: 1 g via INTRAVENOUS
  Filled 2013-09-08 (×2): qty 10

## 2013-09-08 MED ORDER — POLYETHYLENE GLYCOL 3350 17 G PO PACK
17.0000 g | PACK | Freq: Every day | ORAL | Status: DC
  Filled 2013-09-08: qty 1

## 2013-09-08 MED ORDER — VANCOMYCIN HCL 500 MG IV SOLR: 500.0000 mg | INTRAVENOUS | Status: DC

## 2013-09-08 MED ORDER — SODIUM CHLORIDE 0.9 % IV SOLN: 250.0000 mL | INTRAVENOUS | Status: DC | PRN

## 2013-09-08 NOTE — ED Notes (Signed)
I-stat troponin given to Dr. Knapp 

## 2013-09-08 NOTE — Consult Note (Signed)
Patient AV:Taylor Marquez      DOB: September 03, 1919      YNW:[id number]     Consult Note from the Palliative Medicine Team at [facility]    Consult Requested by: Dr Adela Glimpse     PCP: No primary provider on file. Reason for Consultation:Clarification of GOC and options     Phone Number:None  Assessment of patients Current state:  Taylor Marquez is a 79 y.o. female has a past medical history of CHF (congestive heart failure); Coronary disease; Pulmonary hypertension; Diabetes mellitus; Dyslipidemia; CVA (cerebral infarction); and Chronic renal insufficiency.   Admitted with progressive fatigue, loss of ability to communicate   Patient has been moaning as if in pain but the family was not sure what the source was. Patient has been in the past on hospice for end-stage congestive heart failure but this was and discontinued patient did not pass within an expected time. Patient currently has no primary care provider but has been continued to be full by a nurse apparently at home. Family noticed that for the past few days her fingertips started to turn blue and black. Today patient was noted to be poorly responsive.  Admitted through the Er and today family has made the shift to a full comfort approach.   This NP Lorinda Creed reviewed medical records, received report from team, assessed the patient and then meet at the patient's bedside along with her large family and stated HPOA/daughter  Lewayne Bunting # [phone]  to discuss diagnosis prognosis, GOC, EOL wishes disposition and options.   A detailed discussion was had today regarding advanced directives.  Concepts specific to code status, artifical feeding and hydration, continued IV antibiotics and rehospitalization was had.  The difference between a aggressive medical intervention path  and a palliative comfort care path for this patient at this time was had.  Values and goals of care important to patient and family were attempted to be  elicited.  Concept of Hospice and Palliative Care were discussed  Natural trajectory and expectations at EOL were discussed.  Questions and concerns addressed.  Hard Choices booklet left for review. Family encouraged to call with questions or concerns.  PMT will continue to support holistically.       Goals of Care: 1.  Code Status:DNR/DNI   2. Scope of Treatment: 1. Vital Signs: daily  2. Respiratory/Oxygen:for comfort only  3. Nutritional Support/Tube Feeds:no artificial feeding now or in the future 4. Antibiotics: none 5. Blood Products:none 6. GEX:BMWU 7. Review of Medications to be discontinued:minimize for comfort 8. Labs:none 9. Telemetry:none 10. Consults:  SW for residential hospice   4. Disposition:  Hopeful for residential hospice   3. Symptom Management:   1. Anxiety/Agitation: Ativan 1 mg every 4 hrs IV prn 2. Pain/Dyspnea: Morphine 1 mg IV every 1 hr prn 3. Conjunctivitis: Tobramycin opthalmic gtt 2 gtts every 6hrs scheduled  4. Psychosocial:   Emotional support offered to family at bedside  5. Spiritual:  Chaplain services consulted     Patient Documents Completed or Given: Document Given Completed  Advanced Directives Pkt    MOST    DNR    Gone from My Sight    Hard Choices yes     Brief HPI:  Taylor Marquez is a 79 y.o. female has a past medical history of CHF (congestive heart failure); Coronary disease; Pulmonary hypertension; Diabetes mellitus; Dyslipidemia; CVA (cerebral infarction); and Chronic renal insufficiency.   Admitted with progressive fatigue, loss of ability to communicate   Patient  has been moaning as if in pain but the family was not sure what the source was. Patient has been in the past on hospice for end-stage congestive heart failure but this was and discontinued patient did not pass within an expected time. Patient currently has no primary care provider but has been continued to be full by a nurse apparently at home. Family  noticed that for the past few days her fingertips started to turn blue and black. Today patient was noted to be poorly responsive.  Admitted through the Er and today family has made the shift to a full comfort approach.    ROS:  Unable to illicit due to decreased cognition   PMH:  Past Medical History  Diagnosis Date  . CHF (congestive heart failure)   . Coronary disease   . Pulmonary hypertension   . Diabetes mellitus   . Dyslipidemia   . CVA (cerebral infarction)   . Chronic renal insufficiency      PSH: Past Surgical History  Procedure Laterality Date  . Coronary artery bypass graft    . Total hip arthroplasty    . Brain surgery      clipped anuerysm    I have reviewed the FH and SH and  If appropriate update it with new information. No Known Allergies Scheduled Meds: . docusate sodium  100 mg Oral BID  .  morphine injection  1 mg Intravenous Q2H  . polyethylene glycol  17 g Oral Daily  . sodium chloride  3 mL Intravenous Q12H   Continuous Infusions:  PRN Meds:.sodium chloride, acetaminophen, acetaminophen, LORazepam, morphine injection, ondansetron (ZOFRAN) IV, ondansetron, sodium chloride    BP 123/67  Pulse 90  Temp(Src) 93 F (33.9 C) (Axillary)  Resp 28  Ht 5' 6.14" (1.68 m)  Wt 55 kg (121 lb 4.1 oz)  BMI 19.49 kg/m2  SpO2 97%   PPS:30 % at best  No intake or output data in the 24 hours ending 09/08/13 1124   Physical Exam:  General: ill appearing, generally uncomfortable, NAD HEENT:  Both eyes weeping greenish discharge, + temporal muscle wasting Chest:   diminished in bases CTA CVS: RRR Abdomen: flat, NT  diminished BS Ext: BLE with + 2 edema, thickened skin on feet Neuro: lethargic, unable to follow commands  Labs: CBC    Component Value Date/Time   WBC 8.4 08/20/2013 2220   RBC 4.05 09/05/2013 2220   HGB 10.9* 08/23/2013 2220   HCT 31.9* 09/03/2013 2220   PLT 36* 08/21/2013 2220   MCV 78.8 09/02/2013 2220   MCH 26.9 08/24/2013  2220   MCHC 34.2 09/01/2013 2220   RDW 16.9* 09/06/2013 2220   LYMPHSABS 0.2* 09/02/2013 2220   MONOABS 0.3 08/27/2013 2220   EOSABS 0.0 09/05/2013 2220   BASOSABS 0.0 08/16/2013 2220    BMET    Component Value Date/Time   NA 138 08/20/2013 2220   K 4.9 09/03/2013 2220   CL 102 08/25/2013 2220   CO2 21 08/24/2013 2220   GLUCOSE 200* 09/04/2013 2220   BUN 55* 08/15/2013 2220   CREATININE 3.49* 09/03/2013 2220   CALCIUM 9.6 08/22/2013 2220   GFRNONAA 10* 08/19/2013 2220   GFRAA 12* 08/11/2013 2220    CMP     Component Value Date/Time   NA 138 08/14/2013 2220   K 4.9 08/13/2013 2220   CL 102 08/31/2013 2220   CO2 21 08/19/2013 2220   GLUCOSE 200* 08/16/2013 2220   BUN 55* 09/02/2013 2220  CREATININE 3.49* Sep 19, 2013 2220   CALCIUM 9.6 09-19-13 2220   PROT 5.1* 09-19-2013 2220   ALBUMIN 1.9* 09-19-13 2220   AST 32 2013/09/19 2220   ALT 19 2013/09/19 2220   ALKPHOS 77 September 19, 2013 2220   BILITOT 2.5* 2013/09/19 2220   GFRNONAA 10* 09/19/2013 2220   GFRAA 12* September 19, 2013 2220     Time In Time Out Total Time Spent with Patient Total Overall Time  1300 1400 50 min 60 min    Greater than 50%  of this time was spent counseling and coordinating care related to the above assessment and plan.  Lorinda Creed NP  Palliative Medicine Team Team Phone # [phone] Pager [phone]  Discussed with Case manager RN

## 2013-09-08 NOTE — H&P (Signed)
PCP: Been seen mainly by hospice nurse   Chief Complaint:  Patient appears in distress  HPI: Taylor Marquez is a 79 y.o. female   has a past medical history of CHF (congestive heart failure); Coronary disease; Pulmonary hypertension; Diabetes mellitus; Dyslipidemia; CVA (cerebral infarction); and Chronic renal insufficiency.   Presented with  Patient has progressive fatigue loss of ability to communicate. Her family states that lately they have noticed that she had a little bit of her running nose but no fever. No cough. Patient has been moaning as if in pain but the family was not sure what the source was. Patient has been in the past on hospice for end-stage congestive heart failure but this was and discontinued patient did not pass within an expected time. Patient currently has no primary care provider but has been continued to be full by a nurse apparently at home.  Family noticed that for the past few days her fingertips started to turn blue and black.  Today patient was noted to be poorly responsive.  Family at first called her nurse the advised them to call EMS.  On upon arrival EMS patient was found to be hypoglycemic. Despite given D50 she was brought in to emerge department so hypoglycemia did not respond initially. In ER patient was found to be in acute renal failure, hypertension, hypothermia with evidence of DIC. Mild UTI was noted chest x-ray done showing infiltrates. After discussion with family they choose at this point to go complete comfort care route. Will hold off on IV fluids as patient has history of heart failure. Family is agreeable to treating questionable UTI but other than that only keeping patient's comfortable with expectation of possible passing during this admission.admit to palliative care unit    Review of Systems:   the patient unable to provide Past Medical History: Past Medical History  Diagnosis Date  . CHF (congestive heart failure)   . Coronary disease   .  Pulmonary hypertension   . Diabetes mellitus   . Dyslipidemia   . CVA (cerebral infarction)   . Chronic renal insufficiency    Past Surgical History  Procedure Laterality Date  . Coronary artery bypass graft    . Total hip arthroplasty    . Brain surgery      clipped anuerysm      Medications: Prior to Admission medications   Medication Sig Start Date End Date Taking? Authorizing Provider  aspirin EC 81 MG tablet Take 81 mg by mouth daily.   Yes Historical Provider, MD  furosemide (LASIX) 40 MG tablet Take 60 mg by mouth daily.   Yes Historical Provider, MD  HYDROcodone-acetaminophen (NORCO/VICODIN) 5-325 MG per tablet Take 1 tablet by mouth every 6 (six) hours as needed for pain.   Yes Historical Provider, MD  potassium chloride (KLOR-CON) 10 MEQ CR tablet Take 20 mEq by mouth daily.    Yes Historical Provider, MD  senna-docusate (SENOKOT-S) 8.6-50 MG per tablet Take 1 tablet by mouth daily.   Yes Historical Provider, MD    Allergies:  No Known Allergies  Social History:  Bedbound  Lives at home with family    reports that she has never smoked. She does not have any smokeless tobacco history on file. She reports that she does not drink alcohol or use illicit drugs.   Family History: family history includes Diabetes type II in her daughter; Heart disease in her son; Hypertension in her son.    Physical Exam: Patient Vitals for the past  24 hrs:  BP Temp Temp src Pulse Resp SpO2  09/06/2013 2359 - 91.9 F (33.3 C) Rectal - - -  08/28/2013 2348 116/56 mmHg - - - 26 -  09/06/2013 1917 93/56 mmHg - - - - -  08/24/2013 1910 80/44 mmHg 93.2 F (34 C) Rectal 73 28 -    1. General:  Cachectic female thin moaning continuously 2. Psychological:  Somnolent not oriented  3. Head/ENT:    Dry Mucous Membranes                          Head Non traumatic, neck supple                           Poor Dentition 4. SKIN:  decreased Skin turgor,  Skin clean Dry and intact no rash tips of the  fingers and particularly on left hand is cyanotic if possible early gangrene of one of the tips of the digits,  5. Heart: Regular rate and rhythm no Murmur, Rub or gallop 6. Lungs: no wheezes  Occasional crackles   7. Abdomen: Soft, non-tender, Non distended 8. Lower extremities: no clubbing, cyanosis, or edema, Wrapped in dressings bilaterally  9. Neurologically Grossly intact, moving all 4 extremities equally 10. MSK: Normal range of motion  body mass index is unknown because there is no weight on file.   Labs on Admission:   Recent Labs  08/13/2013 2220  NA 138  K 4.9  CL 102  CO2 21  GLUCOSE 200*  BUN 55*  CREATININE 3.49*  CALCIUM 9.6    Recent Labs  09/05/2013 2220  AST 32  ALT 19  ALKPHOS 77  BILITOT 2.5*  PROT 5.1*  ALBUMIN 1.9*    Recent Labs  08/31/2013 2220  LIPASE 17    Recent Labs  08/17/2013 2220  WBC 8.4  NEUTROABS 7.9*  HGB 10.9*  HCT 31.9*  MCV 78.8  PLT 36*   No results found for this basename: CKTOTAL, CKMB, CKMBINDEX, TROPONINI,  in the last 72 hours No results found for this basename: TSH, T4TOTAL, FREET3, T3FREE, THYROIDAB,  in the last 72 hours No results found for this basename: VITAMINB12, FOLATE, FERRITIN, TIBC, IRON, RETICCTPCT,  in the last 72 hours Lab Results  Component Value Date   HGBA1C  Value: 6.3 (NOTE) The ADA recommends the following therapeutic goal for glycemic control related to Hgb A1c measurement: Goal of therapy: <6.5 Hgb A1c  Reference: American Diabetes Association: Clinical Practice Recommendations 2010, Diabetes Care, 2010, 33: (Suppl  1).* 02/24/2009    The CrCl is unknown because both a height and weight (above a minimum accepted value) are required for this calculation. ABG    Component Value Date/Time   PHART 7.346* 09/10/2009 1100   HCO3 26.8* 09/10/2009 1100   TCO2 28.3 09/10/2009 1100   O2SAT 98.9 09/10/2009 1100     No results found for this basename: DDIMER     UA mild UTI    Cultures:    Component  Value Date/Time   SDES STOOL 09/16/2009 2133   SPECREQUEST NONE 09/16/2009 2133   CULT NO GROWTH 09/12/2009 1142   REPTSTATUS 09/17/2009 FINAL 09/16/2009 2133       Radiological Exams on Admission: Dg Chest Port 1 View  09/08/2013   CLINICAL DATA:  Hypoglycemia, weakness  EXAM: PORTABLE CHEST - 1 VIEW  COMPARISON:  02/05/2011; 11/03/2009  FINDINGS: Grossly unchanged enlarged cardiac silhouette  and mediastinal contours with slight differences attributable to patient rotation. Post median sternotomy. Stable position of support apparatus. Atherosclerotic plaque within the thoracic aorta. Grossly unchanged chronic small bilateral effusions and associated bibasilar opacities. Chronic pulmonary venous congestion without definite evidence of edema. No new focal airspace opacities. No definite pneumothorax. Grossly unchanged bones. Faster calcifications overlie the right axilla.  IMPRESSION: 1.  No acute cardiopulmonary disease. 2. Chronic findings of cardiomegaly, chronic pulmonary venous congestion, small bilateral effusions and associated bibasilar atelectasis.   Electronically Signed   By: Simonne Come M.D.   On: 2013-09-10 21:13    Chart has been reviewed  Assessment/Plan 79 year old female with history of end-stage heart failure presents in sepsis with evidence of end organ damage family wishes comfort care only  Present on Admission:  . Sepsis - with evidence of end or bone damage.  Family wishes comfort care. We'll admit to palliative care unit supportive care  . DM - avoid any insulin given hypoglycemia  . Essential hypertension, - hold blood pressure medications   . Acute renal failure -  likely secondary to an sepsis  possible DIC  - comfort care    Prophylaxis: SCD   CODE STATUS:  DNR/DNI comfort care only   Other plan as per orders.  I have spent a total of on this admission extra times taken to discuss care of this patient with family  Emerald Gehres 09/08/2013, 3:13 AM

## 2013-09-08 NOTE — ED Notes (Signed)
POCT CG4 given to Dr. Knapp. 

## 2013-09-08 NOTE — Care Management Note (Addendum)
   CARE MANAGEMENT NOTE 09/08/2013  Patient:  Taylor Marquez   Account Number:  [id number]  Date Initiated:  09/08/2013  Documentation initiated by:  Terrilynn Postell  Subjective/Objective Assessment:   79 yo female admitted with sepsis. PTA pt from home with [facility] and family as primary caretakers.     Action/Plan:   Home when stable   Anticipated DC Date:     Anticipated DC Plan:  HOME W HOME HEALTH SERVICES  In-house referral  Clinical Social Worker      DC Planning Services  CM consult      Choice offered to / List presented to:  NA   DME arranged  NA      DME agency  NA     HH arranged  HH-1 RN      [facility] agency  [facility]   Status of service:  In process, will continue to follow Medicare Important Message given?   (If response is "NO", the following Medicare IM given date fields will be blank) Date Medicare IM given:   Date Additional Medicare IM given:    Discharge Disposition:    Per UR Regulation:  Reviewed for med. necessity/level of care/duration of stay  If discussed at Long Length of Stay Meetings, dates discussed:    Comments:  09/08/13 1045 Taylor Gossen,MSN,RN [phone] Cm spoke with pt's grand daughter at the bedside who states pt is from home with [facility] providing home health. Per H/P MD family agreeable to full comfort care. Pt may be appropriate for a Palliative Care Consult to assess GOC of GIP vs residential hospice vs home with hospice. Per pt's daughter pt was previously released from hospice in February.

## 2013-09-08 NOTE — ED Notes (Signed)
Gave report to 3E. RN asked me to verify that pt. Could be on a non-telemetry floor. Paged [place].

## 2013-09-08 NOTE — Progress Notes (Signed)
Patient is a 79 year old female who is coming to the end of her life (per charge nurse).  Her blood sugar read very low.  Understanding her was difficult.  Getting her oxygen levels were difficult. Finding veins to draw blood was difficult. The oldest daughter (of three present; in her 25's) said the patient's decline has been rapid in the last week.  We talked about that happening in older individuals.  The daughters exhibited concern, but spoke calmly.  They were of the mind that this day would someday come.   Chaplain did pray with the patient who answered for herself when the Chaplain offered prayer.  She did need to have the Chaplain pray into her ear.  Patient was admitted to the hospital.  Rema Jasmine, Chaplain Pager: [phone]

## 2013-09-08 NOTE — Progress Notes (Signed)
Nutrition Brief Note  Patient screened for Malnutrition Screening Tool and Low Braden. Chart reviewed.  Pt now transitioning to comfort care.  No further nutrition interventions warranted at this time.  Please re-consult as needed.   Lloyd Huger MS RD LDN Clinical Dietitian [phone]

## 2013-09-08 NOTE — Progress Notes (Signed)
ANTIBIOTIC CONSULT NOTE - INITIAL  Pharmacy Consult for Vancomycin and Zosyn  Indication: rule out sepsis  No Known Allergies  Patient Measurements: Height: 5' 6.14" (168 cm) Weight: 121 lb 4.1 oz (55 kg) IBW/kg (Calculated) : 59.63 Adjusted Body Weight:   Vital Signs: Temp: 93 F (33.9 C) (12/30 0450) Temp src: Axillary (12/30 0450) BP: 123/67 mmHg (12/30 0450) Pulse Rate: 90 (12/30 0230) Intake/Output from previous day:   Intake/Output from this shift:    Labs:  Recent Labs  09/22/13 2220  WBC 8.4  HGB 10.9*  PLT 36*  CREATININE 3.49*   Estimated Creatinine Clearance: 8.9 ml/min (by C-G formula based on Cr of 3.49). No results found for this basename: VANCOTROUGH, VANCOPEAK, VANCORANDOM, GENTTROUGH, GENTPEAK, GENTRANDOM, TOBRATROUGH, TOBRAPEAK, TOBRARND, AMIKACINPEAK, AMIKACINTROU, AMIKACIN,  in the last 72 hours   Microbiology: No results found for this or any previous visit (from the past 720 hour(s)).  Medical History: Past Medical History  Diagnosis Date  . CHF (congestive heart failure)   . Coronary disease   . Pulmonary hypertension   . Diabetes mellitus   . Dyslipidemia   . CVA (cerebral infarction)   . Chronic renal insufficiency     Medications:  Anti-infectives   Start     Dose/Rate Route Frequency Ordered Stop   09/10/13 1800  vancomycin (VANCOCIN) 500 mg in sodium chloride 0.9 % 100 mL IVPB     500 mg 100 mL/hr over 60 Minutes Intravenous Every 48 hours 09/08/13 0505     09/08/13 1200  piperacillin-tazobactam (ZOSYN) IVPB 2.25 g     2.25 g 100 mL/hr over 30 Minutes Intravenous 4 times per day 09/08/13 0502     09/08/13 0515  cefTRIAXone (ROCEPHIN) 1 g in dextrose 5 % 50 mL IVPB     1 g 100 mL/hr over 30 Minutes Intravenous Every 24 hours 09/08/13 0507     09/08/13 0100  piperacillin-tazobactam (ZOSYN) IVPB 3.375 g     3.375 g 100 mL/hr over 30 Minutes Intravenous  Once 09/08/13 0058 09/08/13 0326   09/08/13 0100  vancomycin (VANCOCIN)  IVPB 1000 mg/200 mL premix     1,000 mg 200 mL/hr over 60 Minutes Intravenous  Once 09/08/13 0058 09/08/13 0425     Assessment: Patient with sepsis, MD notes comfort care.  First dose of antibiotics already given in ED.  Patient with ARF, CrCl very poor <10.  Initial dose of 1gm vancomycin will last > 48hr.  Goal of Therapy:  Vancomycin trough level 15-20 mcg/ml Zosyn based on renal function   Plan:  Follow up culture results Vancomycin 500mg  iv q48hr Zosyn 2.5gm iv q6hr Pharmacy will follow up with plan for labs, if needed.  Darlina Guys, Jacquenette Shone Crowford 09/08/2013,5:07 AM

## 2013-09-08 NOTE — Progress Notes (Addendum)
[facility] PROGRESS NOTE  Taylor Marquez ZOX:[id number] DOB: 01-02-1919 DOA: 09/01/2013 PCP: No primary provider on file.  Assessment/Plan:  Sepsis Hypothermic 91 - 93 Lactic acid 5 Hypotensive. Presently on vanc and zoyn.  Talked with daughter Lewayne Bunting.  We will discontinue antibiotics.  Abdominal pain Uncertain if this is bowel or bladder. Will place foley for comfort (no output noted when bed was changed) Miralax Morphine 1 mg q 2 schedule, and 1 mg q 2 PRN.  DM with Hypoglycemia Corrected with D50, but patient is not eating. Lewayne Bunting has requested that we keep correcting her hypoglycemia  CHF Evidence of volume overload on cxr. IVF is saline locked. Daughter understands that her mothers heart is unable to pump effectively.  Acute renal failure with history of Chronic renal insufficiency No obvious UTI. Discussed with daughter will d/c antibiotics.  Code Status: Comfort care, DNR. Family Communication: grand dtr at bedside.  Talked with Lewayne Bunting on the phone.  Daughter is very clear that her mother may pass today.  Her goal for her mother is comfort.  Ideally she would like for her mother to live until tomorrow evening (12/31) when her brother will arrive. Disposition Plan: poor prognosis.  Inpatient.    Consults Social work consulted for possible GIP. Palliative Med consulted.     Antibiotics:  Vanc and Zosyn  HPI/Subjective: Patient grumbles and grabs at her lower abdomen.  Objective: Filed Vitals:   09/08/13 0345 09/08/13 0351 09/08/13 0450 09/08/13 0501  BP: 98/55  123/67   Pulse:      Temp:  93.4 F (34.1 C) 93 F (33.9 C)   TempSrc:  Rectal Axillary   Resp: 31  28   Height:    5' 6.14" (1.68 m)  Weight:   55 kg (121 lb 4.1 oz)   SpO2:   97%    No intake or output data in the 24 hours ending 09/08/13 1019 Filed Weights   09/08/13 0450  Weight: 55 kg (121 lb 4.1 oz)    Exam: General: cachectic, chronically ill  appearing, female, grand dtr at bedside.  HEENT:  PERR, EOMI, Anicteic Sclera, MMM. No pharyngeal erythema or exudates  Neck: Supple, no JVD, no masses  Cardiovascular: RRR, S1 S2 auscultated, no rubs, murmurs or gallops.   Respiratory: Clear to auscultation bilaterally with equal chest rise  Abdomen: Soft, slightly pertuberant, tender to palpation. + bowel sounds  Neuro: AAOx3, cranial nerves grossly intact. Strength 5/5 in upper and lower extremities  Skin: bandages on LE are clean and dry. Psych: talks unintelligibly, per grand dtr she is hallucinating.       Data Reviewed: Basic Metabolic Panel:  Recent Labs Lab 08/29/2013 2220  NA 138  K 4.9  CL 102  CO2 21  GLUCOSE 200*  BUN 55*  CREATININE 3.49*  CALCIUM 9.6   Liver Function Tests:  Recent Labs Lab 09/02/2013 2220  AST 32  ALT 19  ALKPHOS 77  BILITOT 2.5*  PROT 5.1*  ALBUMIN 1.9*    Recent Labs Lab 08/26/2013 2220  LIPASE 17   CBC:  Recent Labs Lab 08/12/2013 2220  WBC 8.4  NEUTROABS 7.9*  HGB 10.9*  HCT 31.9*  MCV 78.8  PLT 36*   CBG:  Recent Labs Lab 08/12/2013 2134 08/15/2013 2234 09/08/13 0200 09/08/13 0446 09/08/13 0618  GLUCAP 23* 179* 52* 67* 154*      Studies: Dg Chest Port 1 View  08/13/2013   CLINICAL DATA:  Hypoglycemia, weakness  EXAM: PORTABLE  CHEST - 1 VIEW  COMPARISON:  02/05/2011; 11/03/2009  FINDINGS: Grossly unchanged enlarged cardiac silhouette and mediastinal contours with slight differences attributable to patient rotation. Post median sternotomy. Stable position of support apparatus. Atherosclerotic plaque within the thoracic aorta. Grossly unchanged chronic small bilateral effusions and associated bibasilar opacities. Chronic pulmonary venous congestion without definite evidence of edema. No new focal airspace opacities. No definite pneumothorax. Grossly unchanged bones. Faster calcifications overlie the right axilla.  IMPRESSION: 1.  No acute cardiopulmonary disease. 2.  Chronic findings of cardiomegaly, chronic pulmonary venous congestion, small bilateral effusions and associated bibasilar atelectasis.   Electronically Signed   By: Simonne Come M.D.   On: 08/29/2013 21:13    Scheduled Meds: . cefTRIAXone (ROCEPHIN) IVPB 1 gram/50 mL D5W  1 g Intravenous Q breakfast  . docusate sodium  100 mg Oral BID  .  morphine injection  1 mg Intravenous Q2H  . polyethylene glycol  17 g Oral Daily  . sodium chloride  3 mL Intravenous Q12H   Continuous Infusions:   Active Problems:   DM   Essential hypertension, benign   Atrial fibrillation   RENAL INSUFFICIENCY   Sepsis   Acute renal failure    Taylor Marquez  [facility] Pager [phone]. If 7PM-7AM, please contact night-coverage at [email or web address], password [facility] 09/08/2013, 10:19 AM  LOS: 1 day

## 2013-09-09 DIAGNOSIS — D65 Disseminated intravascular coagulation [defibrination syndrome]: Secondary | ICD-10-CM | POA: Diagnosis present

## 2013-09-09 DIAGNOSIS — D696 Thrombocytopenia, unspecified: Secondary | ICD-10-CM | POA: Diagnosis present

## 2013-09-09 DIAGNOSIS — E162 Hypoglycemia, unspecified: Secondary | ICD-10-CM | POA: Diagnosis present

## 2013-09-09 DIAGNOSIS — N179 Acute kidney failure, unspecified: Secondary | ICD-10-CM

## 2013-09-09 LAB — URINE CULTURE
Colony Count: NO GROWTH
Culture: NO GROWTH

## 2013-09-09 LAB — GLUCOSE, CAPILLARY
Glucose-Capillary: 23 mg/dL — CL (ref 70–99)
Glucose-Capillary: <10 mg/dL — CL (ref 70–99)

## 2013-09-10 NOTE — Discharge Summary (Signed)
Death Summary  [name] ZOX:[id number] DOB: [birth year] DOA: 09/24/13  PCP: No primary provider on file.   Admit date: Sep 24, 2013 Date of Death: 26-Sep-2013  Final Diagnoses:  Principal Problem:    Sepsis secondary to gram positive bacteremia Active Problems:    DM    Essential hypertension, benign    Atrial fibrillation    Acute renal failure    Palliative care encounter    Pain, generalized    Dyspnea    Stage III chronic kidney disease    Hypoglycemia    Thrombocytopenia, unspecified    DIC (disseminated intravascular coagulation)  History of present illness:  Taylor Marquez is an 80 y.o. female with a PMH of CHF, CAD, pulmonary hypertension, diabetes, dyslipidemia, prior stroke, and stage III chronic kidney disease was admitted with altered mental status. Upon initial presentation, she was hypoglycemic with evidence of DIC/sepsis. Patient's family requested comfort care.  Hospital Course:  Patient was admitted in a septic evaluation was undertaken. One set of blood cultures is growing gram-positive cocci in chains. Urine culture was negative. No obvious pneumonia on chest radiography. Patient was initially treated with vancomycin and Zosyn and dextrose infusions as needed for recurrent hypoglycemia. Antibiotic therapy was withdrawn on 09/08/2013 as the family has requested full comfort care.  Time of death: 10:40 AM  Signed:  RAMA,CHRISTINA  [facility] 09/26/13, 11:09 AM

## 2013-09-10 NOTE — Progress Notes (Signed)
At 0955 death was pronounced by this RN and Charlynne Pander, RN. Pt. Had no pulse or breath sounds.  MD was notified of death.

## 2013-09-10 NOTE — Progress Notes (Signed)
Support for patient's daughters at her bedside when patient died. A son is on the way. They expect him here by 12:00. Will support him when he arrives.  Presence; listening; prayer. Family would like to take the patient back to her home in [place] for burial.

## 2013-09-10 NOTE — Clinical Documentation Improvement (Signed)
Noted 12 30/14 prog note..."CHF  Evidence of volume overload on cxr.  IVF is saline locked."  2013-09-10 CXR..."IMPRESSION: 1. No acute cardiopulmonary disease. 2. Chronic findings of cardiomegaly, chronic pulmonary venous congestion, small bilateral effusions and associated bibasilar atelectasis." Labs: No BNP noted, no echo. Per H&P and note patient was prior hospice for "End Stage CHF". For accurate DX specificity & severity can noted "CHF" be further specified w/ type & acuity. Thank you   Possible Clinical Conditions? Chronic Systolic or Diastolic Congestive Heart Failure Chronic Systolic & Diastolic Congestive Heart Failure Acute Systolic or Diastolic Heart Failure Acute Systolic & Diastolic Congestive Heart Failure Acute on Chronic Systolic or Diastolic Congestive Heart Failure Acute on Chronic Systolic & Diastolic Congestive Heart Failure Other Condition Cannot Clinically Determine  Supporting Information: Risk Factors: See above note Signs & Symptoms: See above note Diagnostics: See above note Treatment: See above note  Thank You, Toribio Harbour, RN, BSN, CCDS Certified Clinical Documentation Specialist Pager: [phone]  [facility]- Health Information Management

## 2013-09-10 NOTE — Clinical Documentation Improvement (Signed)
09/08/13 prog note.Marland KitchenMarland Kitchen"Acute renal failure with history of Chronic renal insufficiency.  No obvious UTI. Discussed with daughter will d/c antibiotics." Labs: 08/26/2013 BUN 55*; CREATININE 3.49*; GFR, Est Afr Am(mL/min) 12L TX: Labs evaluated, IV fluids saline locked, full comfort care  For accurate Dx specificity & severity can noted " Chronic Renal Insufficiency" be further specified with stage & cond being eval, mon'd & tx'd. Thank you  Possible Clinical Conditions?  CKD Stage I - GFR > OR = 90 CKD Stage II - GFR 60-80 CKD Stage III - GFR 30-59 CKD Stage IV - GFR 15-29 CKD Stage V - GFR < 15 ESRD (End Stage Renal Disease) Other condition Cannot Clinically determine   Supporting Information:  Risk Factors: See above note Signs & Symptoms: See above note Diagnostics: See above note Treatment: See above note  Thank You, Toribio Harbour, RN, BSN, CCDS Certified Clinical Documentation Specialist Pager: [phone]  [facility]- Health Information Management

## 2013-09-10 NOTE — Progress Notes (Signed)
Follow up from earlier today. Returned to support additional family that arrived from [place].

## 2013-09-10 DEATH — deceased

## 2013-09-11 NOTE — Consult Note (Signed)
I have reviewed this case with our NP and agree with the Assessment and Plan as stated.  Kace Hartje L. Adie Vilar, MD MBA [facility] at [place] Team Phone: [phone] Pager: [phone]   

## 2013-09-12 LAB — CULTURE, BLOOD (ROUTINE X 2)

## 2013-09-14 LAB — CULTURE, BLOOD (ROUTINE X 2): Culture: NO GROWTH

## 2013-10-11 DEATH — deceased

## 2014-11-20 IMAGING — CR DG CHEST 1V PORT
1 series · 1 of 1 positions shown · non-contrast
Comparison: 02/05/2011; 11/03/2009

CLINICAL DATA: Hypoglycemia, weakness

EXAM:
PORTABLE CHEST - 1 VIEW

[AP]
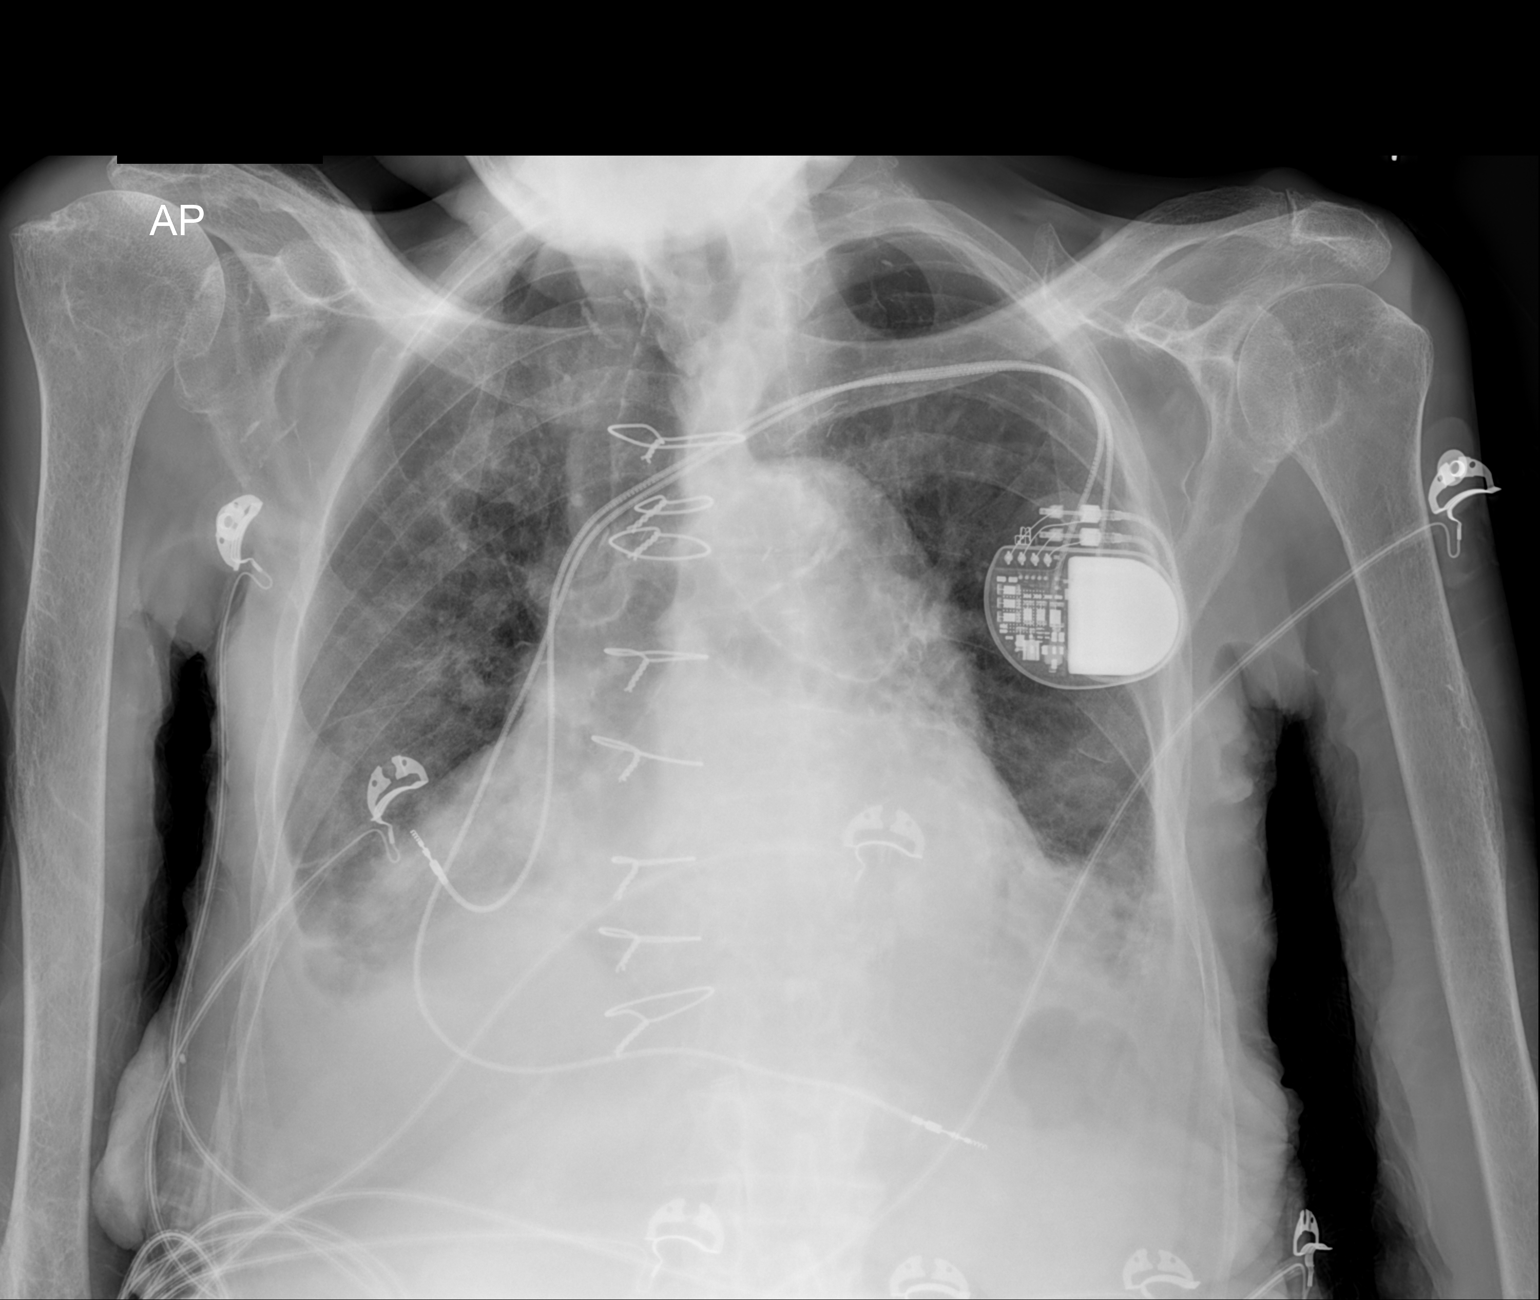

[1 of 1 positions shown; findings below may reference images not displayed]

FINDINGS: Grossly unchanged enlarged cardiac silhouette and mediastinal
contours with slight differences attributable to patient rotation.
Post median sternotomy. Stable position of support apparatus.
Atherosclerotic plaque within the thoracic aorta. Grossly unchanged
chronic small bilateral effusions and associated bibasilar
opacities. Chronic pulmonary venous congestion without definite
evidence of edema. No new focal airspace opacities. No definite
pneumothorax. Grossly unchanged bones. Faster calcifications overlie
the right axilla.
IMPRESSION: 1.  No acute cardiopulmonary disease.
2. Chronic findings of cardiomegaly, chronic pulmonary venous
congestion, small bilateral effusions and associated bibasilar
atelectasis.
